# Patient Record
Sex: Male | Born: 1980 | Race: White | Hispanic: No | Smoking: Former smoker
Health system: Southern US, Community
[De-identification: ages and names within clinical notes are randomized; demographics above are authoritative.]

## PROBLEM LIST (undated history)

## (undated) DIAGNOSIS — R3915 Urgency of urination: Secondary | ICD-10-CM

## (undated) DIAGNOSIS — R519 Headache, unspecified: Secondary | ICD-10-CM

## (undated) DIAGNOSIS — N200 Calculus of kidney: Secondary | ICD-10-CM

## (undated) DIAGNOSIS — E236 Other disorders of pituitary gland: Secondary | ICD-10-CM

## (undated) DIAGNOSIS — Z87442 Personal history of urinary calculi: Secondary | ICD-10-CM

## (undated) DIAGNOSIS — R51 Headache: Secondary | ICD-10-CM

## (undated) DIAGNOSIS — H5319 Other subjective visual disturbances: Secondary | ICD-10-CM

## (undated) DIAGNOSIS — E790 Hyperuricemia without signs of inflammatory arthritis and tophaceous disease: Secondary | ICD-10-CM

## (undated) DIAGNOSIS — K219 Gastro-esophageal reflux disease without esophagitis: Secondary | ICD-10-CM

## (undated) DIAGNOSIS — Z8709 Personal history of other diseases of the respiratory system: Secondary | ICD-10-CM

## (undated) DIAGNOSIS — E291 Testicular hypofunction: Secondary | ICD-10-CM

## (undated) HISTORY — DX: Other disorders of pituitary gland: E23.6

## (undated) HISTORY — PX: EXTRACORPOREAL SHOCK WAVE LITHOTRIPSY: SHX1557

## (undated) HISTORY — PX: HYPOSPADIAS CORRECTION: SHX483

---

## 2003-03-04 ENCOUNTER — Encounter: Payer: Self-pay | Admitting: Emergency Medicine

## 2003-03-04 ENCOUNTER — Emergency Department (HOSPITAL_COMMUNITY): Admission: EM | Admit: 2003-03-04 | Discharge: 2003-03-04 | Payer: Self-pay | Admitting: Emergency Medicine

## 2008-05-12 HISTORY — PX: REFRACTIVE SURGERY: SHX103

## 2008-08-17 ENCOUNTER — Encounter: Admission: RE | Admit: 2008-08-17 | Discharge: 2008-08-17 | Payer: Self-pay | Admitting: Family Medicine

## 2008-08-22 ENCOUNTER — Encounter: Payer: Self-pay | Admitting: Endocrinology

## 2008-08-30 ENCOUNTER — Encounter: Admission: RE | Admit: 2008-08-30 | Discharge: 2008-08-30 | Payer: Self-pay | Admitting: Family Medicine

## 2008-08-31 ENCOUNTER — Encounter: Payer: Self-pay | Admitting: Endocrinology

## 2008-09-08 ENCOUNTER — Encounter: Admission: RE | Admit: 2008-09-08 | Discharge: 2008-09-08 | Payer: Self-pay | Admitting: Neurology

## 2008-09-11 ENCOUNTER — Encounter: Payer: Self-pay | Admitting: Endocrinology

## 2008-09-18 ENCOUNTER — Encounter: Payer: Self-pay | Admitting: Endocrinology

## 2008-09-29 ENCOUNTER — Ambulatory Visit: Payer: Self-pay | Admitting: Endocrinology

## 2008-09-29 DIAGNOSIS — E236 Other disorders of pituitary gland: Secondary | ICD-10-CM | POA: Insufficient documentation

## 2008-09-29 DIAGNOSIS — J309 Allergic rhinitis, unspecified: Secondary | ICD-10-CM | POA: Insufficient documentation

## 2008-10-04 ENCOUNTER — Ambulatory Visit: Payer: Self-pay | Admitting: Endocrinology

## 2008-10-19 LAB — CONVERTED CEMR LAB
Calcium: 9.3 mg/dL (ref 8.4–10.5)
Creatinine, Ser: 0.9 mg/dL (ref 0.4–1.5)
Sodium: 142 meq/L (ref 135–145)
Testosterone: 212.19 ng/dL — ABNORMAL LOW (ref 350.00–890.00)

## 2009-07-04 ENCOUNTER — Ambulatory Visit: Payer: Self-pay | Admitting: Endocrinology

## 2009-07-04 DIAGNOSIS — E291 Testicular hypofunction: Secondary | ICD-10-CM | POA: Insufficient documentation

## 2009-07-04 DIAGNOSIS — N209 Urinary calculus, unspecified: Secondary | ICD-10-CM | POA: Insufficient documentation

## 2009-08-03 ENCOUNTER — Ambulatory Visit: Payer: Self-pay | Admitting: Endocrinology

## 2009-08-05 LAB — CONVERTED CEMR LAB: Cortisol, Plasma: 0.6 ug/dL

## 2009-08-10 ENCOUNTER — Telehealth: Payer: Self-pay | Admitting: Endocrinology

## 2009-12-11 ENCOUNTER — Encounter: Payer: Self-pay | Admitting: Endocrinology

## 2010-03-19 ENCOUNTER — Encounter: Admission: RE | Admit: 2010-03-19 | Discharge: 2010-03-19 | Payer: Self-pay | Admitting: Neurology

## 2010-06-02 ENCOUNTER — Encounter: Payer: Self-pay | Admitting: Neurology

## 2010-06-11 NOTE — Progress Notes (Signed)
Summary: RX  Phone Note Call from Patient Call back at Home Phone [phone]   Caller: Patient [phone] Summary of Call: pt called to inform MD that he would like to start Clomid Rx Initial call taken by: Margaret Pyle, CMA,  August 10, 2009 1:27 PM  Follow-up for Phone Call        i sent rx ret 4-6 weeks Follow-up by: Minus Breeding MD,  August 10, 2009 1:30 PM  Additional Follow-up for Phone Call Additional follow up Details #1::        pt informed and will call back to sch Additional Follow-up by: Margaret Pyle, CMA,  August 10, 2009 1:41 PM    New/Updated Medications: CLOMIPHENE CITRATE 50 MG TABS (CLOMIPHENE CITRATE) 1/4 tab once daily Prescriptions: CLOMIPHENE CITRATE 50 MG TABS (CLOMIPHENE CITRATE) 1/4 tab once daily  #15 x 1   Entered and Authorized by:   Minus Breeding MD   Signed by:   Minus Breeding MD on 08/10/2009   Method used:   Electronically to        [id number] (retail)       [address]       [place], [place]  [address]       Ph: [phone] or [phone]       Fax: [phone]   RxID:   [id number]

## 2010-06-11 NOTE — Letter (Signed)
Summary: [facility]  [facility]   Imported By: Sherian Rein 12/17/2009 12:24:01  _____________________________________________________________________  External Attachment:    Type:   Image     Comment:   External Document

## 2010-06-11 NOTE — Assessment & Plan Note (Signed)
Summary: PT WANTS TO F/U WITH SAE/#/CD   Vital Signs:  Patient profile:   30 year old male Height:      69 inches (175.26 cm) Weight:      229 pounds (104.09 kg) BMI:     33.94 O2 Sat:      98 % on Room air Temp:     97.6 degrees F (36.44 degrees C) oral Pulse rate:   73 / minute BP sitting:   120 / 70  (left arm) Cuff size:   large  Vitals Entered By: Josph Macho RMA (July 04, 2009 11:09 AM)  O2 Flow:  Room air CC: Follow-up visit/ pt states he is no longer using Loratadine, Zaditor, Veramyst, or Bepreve/ CF Is Patient Diabetic? No   Referring Provider:  Dr Amelia Jo Primary Provider:  Dr Juluis Rainier  CC:  Follow-up visit/ pt states he is no longer using Loratadine, Zaditor, Veramyst, and or Bepreve/ CF.  History of Present Illness: pt states he feels well in general.  he has h/o small pituitary adenoma. he has no change in chronic visual disturbances.   he states slightly decreased libido.  he has never taken any androgen products.  Current Medications (verified): 1)  Ketoprofen 75 Mg Caps (Ketoprofen) .Marland Kitchen.. 1 Tab As Needed 2)  Divalproex Sodium 250 Mg Tbec (Divalproex Sodium) .Marland Kitchen.. 1 Tab Daily 3)  Isometheptene-Apap-Dichloral 65-325-100 Mg Caps (Apap-Isometheptene-Dichloral) .... As Needed 4)  Prilosec Otc 20 Mg Tbec (Omeprazole Magnesium) .Marland Kitchen.. 1 Tab Daily 5)  Gnp Loratadine 10 Mg Tabs (Loratadine) .Marland Kitchen.. 1 Tab Daily 6)  Zaditor 0.025 % Soln (Ketotifen Fumarate) .... 2 Drops Each Eye Daily 7)  Veramyst 27.5 Mcg/spray Susp (Fluticasone Furoate) .... Prn 8)  Bepreve 1.5 % Soln (Bepotastine Besilate) .... Prn 9)  Dexamethasone 1 Mg Tabs (Dexamethasone) .Marland Kitchen.. 1 Tab At 10 Pm The Night Before Blood Test  Allergies (verified): No Known Drug Allergies  Family History: no pituitary dz.  Social History: Reviewed history from 09/29/2008 and no changes required. single.  has domestic partner. works Education officer, environmental.  Review of Systems       denies erectile  dysfunction.  he denies headache  Physical Exam  General:  normal appearance.   Genitalia:  Normal external male genitalia with no urethral discharge.    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 Take at 10 pm, the night before blood test  Other Orders: Est. Patient Level IV (295[address])  Patient Instructions: 1)  tests are being ordered for you today.  a few days after the test(s), please call [phone] to hear your test results. 2)  i'll hold-off on the mri of the pituitary, because dr lewitt is going to do the whole-brain mri anyway.  for your needs, the whole brain mri would be sufficient.should do a "dexamethasone suppression test."  for this, you would take dexamethasone 1 mg (i have sent prescription to your pharmacy) at 10 pm, then come in for a "cortisol" blood test (253.8)  the next morning before 9 am.  you do not need to be fasting for this test. 3)  we'll also do the other blood tests then: 4)  prolactin, testosterone, and LH (all 257.2). 5)  cc dr lewitt 6)  we discussed the  risks of normalization of testosterone, including increased fertility, hair loss, prostate cancer, benign prostate enlargement, lower hdl, sleep apnea, and behavior changes 7)  we discussed clomiphine if testosterone and LH are low Prescriptions: DEXAMETHASONE 1 MG TABS (DEXAMETHASONE) take at 10 pm, the night before blood test  #1 tab x 0   Entered and Authorized by:   Minus Breeding MD   Signed by:   Minus Breeding MD on 07/04/2009   Method used:   Electronically to        [id number] (retail)       [address]       [place], [place]  [address]       Ph: [phone] or [phone]       Fax: [phone]   RxID:    [id number]

## 2010-06-19 ENCOUNTER — Ambulatory Visit (HOSPITAL_COMMUNITY): Payer: Self-pay

## 2010-06-27 ENCOUNTER — Ambulatory Visit (HOSPITAL_COMMUNITY): Payer: Self-pay

## 2010-07-31 ENCOUNTER — Ambulatory Visit (INDEPENDENT_AMBULATORY_CARE_PROVIDER_SITE_OTHER): Payer: Managed Care, Other (non HMO) | Admitting: Endocrinology

## 2010-07-31 ENCOUNTER — Other Ambulatory Visit (INDEPENDENT_AMBULATORY_CARE_PROVIDER_SITE_OTHER): Payer: Managed Care, Other (non HMO)

## 2010-07-31 ENCOUNTER — Encounter: Payer: Self-pay | Admitting: Endocrinology

## 2010-07-31 VITALS — BP 114/66 | HR 75 | Temp 98.3°F | Ht 69.0 in | Wt 214.8 lb

## 2010-07-31 DIAGNOSIS — R519 Headache, unspecified: Secondary | ICD-10-CM | POA: Insufficient documentation

## 2010-07-31 DIAGNOSIS — E291 Testicular hypofunction: Secondary | ICD-10-CM

## 2010-07-31 DIAGNOSIS — R51 Headache: Secondary | ICD-10-CM

## 2010-07-31 MED ORDER — CLOMIPHENE CITRATE 50 MG PO TABS: ORAL_TABLET | ORAL | Status: DC

## 2010-07-31 NOTE — Progress Notes (Signed)
  Subjective:    Patient ID: Raymond Chandler, male    DOB: Oct 13, 1980, 30 y.o.   MRN: [id number]  HPI Pt is here to f/u hypogonadism, and pituitary adenoma.  He had a f/u mri 4 mos ago, which was unchanged.  pt states he feels well in general.  He only took clomid x 1 month "because i was going to so many doctors."  However, he says it helped his general sxs.  He has decreased libido, but no erectile dysfunction.  He has some anxiety, and what he describes as a short temper.   Past Medical History  Diagnosis Date  . OTH PITUITARY DISORDERS & SYNDROMES 09/29/2008  . HYPOGONADISM 07/04/2009  . URINARY CALCULUS 07/04/2009   History reviewed. No pertinent past surgical history.  reports that he has been smoking Cigars.  He does not have any smokeless tobacco history on file. He reports that he drinks alcohol. He reports that he does not use illicit drugs. family history is not on file. No Known Allergies  Review of Systems He also has some dysuria, which he attributes to urolithiasis, for which he sees urol.      Objective:   Physical Exam Gen: no distress Exernal genitalia: normal. Ext: no edema Psych:  Does not appear anxious nor depressed.        Assessment & Plan:  Secondary hypogonadism.  He needs to resume clomid.

## 2010-07-31 NOTE — Patient Instructions (Addendum)
blood tests are being ordered for you today.  please call [phone] to hear your test results. normalization of testosterone is not known to harm you.  however, there are "theoretical" risks, including increased fertility, hair loss, prostate cancer, benign prostate enlargement, lower hdl ("good cholesterol"), sleep apnea, and behavior changes Return here in 6 months. (update: i left message on phone-tree:  i refilled clomid).

## 2011-09-10 ENCOUNTER — Ambulatory Visit (INDEPENDENT_AMBULATORY_CARE_PROVIDER_SITE_OTHER): Payer: Managed Care, Other (non HMO) | Admitting: Endocrinology

## 2011-09-10 ENCOUNTER — Encounter: Payer: Self-pay | Admitting: Endocrinology

## 2011-09-10 VITALS — BP 102/72 | HR 96 | Temp 97.2°F | Ht 69.0 in | Wt 209.0 lb

## 2011-09-10 DIAGNOSIS — E236 Other disorders of pituitary gland: Secondary | ICD-10-CM

## 2011-09-10 DIAGNOSIS — E291 Testicular hypofunction: Secondary | ICD-10-CM

## 2011-09-10 MED ORDER — CLOMIPHENE CITRATE 50 MG PO TABS: ORAL_TABLET | ORAL | Status: DC

## 2011-09-10 NOTE — Patient Instructions (Addendum)
Resume the clomiphine.  i have sent a prescription to your pharmacy blood tests are being requested for you.   Please have drawn in 1 month.  You will receive a letter with results. Please return in 1 year. normalization of testosterone is not known to harm you.  however, there are "theoretical" risks, including increased fertility, hair loss, prostate cancer, benign prostate enlargement, blood clots, liver problems, lower hdl ("good cholesterol"), sleep apnea, and behavior changes.

## 2011-09-10 NOTE — Progress Notes (Signed)
  Subjective:    Patient ID: Raymond Chandler, male    DOB: [birth year], 31 y.o.   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  He ran out of clomid 1 month ago.  On the clomid, he still had slight ED sxs.  He shaves every day he works.   Past Medical History  Diagnosis Date  . OTH PITUITARY DISORDERS & SYNDROMES 09/29/2008  . HYPOGONADISM 07/04/2009  . URINARY CALCULUS 07/04/2009    No past surgical history on file.  History   Social History  . Marital Status: Single    Spouse Name: N/A    Number of Children: N/A  . Years of Education: N/A   Occupational History  .      Works Education officer, environmental   Social History Main Topics  . Smoking status: Former Smoker    Types: Cigars  . Smokeless tobacco: Not on file   Comment: smokes cigars maybe twice a year  . Alcohol Use: Yes     rarely (4 alcoholic drinks per month-wine and beer)  . Drug Use: No  . Sexually Active: Not on file   Other Topics Concern  . Not on file   Social History Narrative   Has domestic partner    Current Outpatient Prescriptions on File Prior to Visit  Medication Sig Dispense Refill  . allopurinol (ZYLOPRIM) 100 MG tablet Take 100 mg by mouth daily.        Marland Kitchen aspirin 81 MG tablet Take 81 mg by mouth daily.        . hydrochlorothiazide 25 MG tablet Take 25 mg by mouth daily.        Marland Kitchen imipramine (TOFRANIL) 25 MG tablet       . omeprazole (PRILOSEC OTC) 20 MG tablet Take 20 mg by mouth daily.         No Known Allergies  No family history on file.  BP 102/72  Pulse 96  Temp(Src) 97.2 F (36.2 C) (Oral)  Ht 5\' 9"  (1.753 m)  Wt 209 lb (94.802 kg)  BMI 30.86 kg/m2  SpO2 99%  Review of Systems He has lost weight, due to his efforts.      Objective:   Physical Exam VITAL SIGNS:  See vs page GENERAL: no distress GENITALIA: Normal male testicles, scrotum, and penis Skin: normal hair distribution     Assessment & Plan:  Hypogonadism.  He should resume  clomid.

## 2011-09-24 ENCOUNTER — Other Ambulatory Visit: Payer: Self-pay | Admitting: Endocrinology

## 2011-09-24 DIAGNOSIS — E236 Other disorders of pituitary gland: Secondary | ICD-10-CM

## 2011-10-10 ENCOUNTER — Other Ambulatory Visit (INDEPENDENT_AMBULATORY_CARE_PROVIDER_SITE_OTHER): Payer: Managed Care, Other (non HMO)

## 2011-10-10 DIAGNOSIS — E236 Other disorders of pituitary gland: Secondary | ICD-10-CM

## 2011-10-10 LAB — FOLLICLE STIMULATING HORMONE: FSH: 4.6 m[IU]/mL (ref 1.4–18.1)

## 2011-10-10 LAB — LUTEINIZING HORMONE: LH: 6.26 m[IU]/mL (ref 1.50–9.30)

## 2011-10-11 LAB — PROLACTIN: Prolactin: 5.5 ng/mL (ref 2.1–17.1)

## 2011-10-13 ENCOUNTER — Encounter: Payer: Self-pay | Admitting: Endocrinology

## 2011-10-13 LAB — ACTH: C206 ACTH: 18 pg/mL (ref 10–46)

## 2011-10-14 ENCOUNTER — Telehealth: Payer: Self-pay | Admitting: *Deleted

## 2011-10-14 NOTE — Telephone Encounter (Signed)
Called pt to inform of lab results, pt informed (letter also mailed to pt). Labs also faxed to Dr. Glade Stanford office per pt's request and Dr. Glade Stanford request.

## 2011-10-15 LAB — GROWTH HORMONE: Growth Hormone: <0.1 ng/mL (ref 0.00–3.00)

## 2011-11-05 ENCOUNTER — Other Ambulatory Visit: Payer: Self-pay | Admitting: Endocrinology

## 2012-01-13 ENCOUNTER — Other Ambulatory Visit: Payer: Self-pay | Admitting: Neurology

## 2012-01-13 DIAGNOSIS — E236 Other disorders of pituitary gland: Secondary | ICD-10-CM

## 2012-01-15 ENCOUNTER — Inpatient Hospital Stay
Admission: RE | Admit: 2012-01-15 | Discharge: 2012-01-15 | Payer: Managed Care, Other (non HMO) | Source: Ambulatory Visit | Attending: Neurology | Admitting: Neurology

## 2012-12-08 ENCOUNTER — Other Ambulatory Visit: Payer: Self-pay | Admitting: Neurology

## 2012-12-08 DIAGNOSIS — E237 Disorder of pituitary gland, unspecified: Secondary | ICD-10-CM

## 2012-12-18 ENCOUNTER — Ambulatory Visit
Admission: RE | Admit: 2012-12-18 | Discharge: 2012-12-18 | Disposition: A | Discharge status: Home / Self Care | Payer: Managed Care, Other (non HMO) | Source: Ambulatory Visit | Attending: Neurology | Admitting: Neurology

## 2012-12-18 DIAGNOSIS — E237 Disorder of pituitary gland, unspecified: Secondary | ICD-10-CM

## 2012-12-18 MED ORDER — GADOBENATE DIMEGLUMINE 529 MG/ML IV SOLN
15.0000 mL | Freq: Once | INTRAVENOUS | Status: AC | PRN
  Administered 2012-12-18: 15 mL via INTRAVENOUS

## 2013-04-20 ENCOUNTER — Other Ambulatory Visit: Payer: Self-pay | Admitting: Urology

## 2013-04-26 ENCOUNTER — Other Ambulatory Visit: Payer: Self-pay | Admitting: Urology

## 2013-04-27 ENCOUNTER — Encounter (HOSPITAL_BASED_OUTPATIENT_CLINIC_OR_DEPARTMENT_OTHER): Payer: Self-pay | Admitting: *Deleted

## 2013-04-27 ENCOUNTER — Other Ambulatory Visit: Payer: Self-pay | Admitting: Urology

## 2013-04-27 NOTE — Progress Notes (Signed)
NPO AFTER MN. ARRIVE AT 0900. NEEDS HG. WILL TAKE ATENOLOL AND NEXIUM  AM DOS W/ SIPS OF WATER AND IF NEEDED TAKE HYDROCODONE. PLEASE GET NAME OF MIGRAINE MED. HE TAKES AT NIGHT.

## 2013-04-29 ENCOUNTER — Ambulatory Visit (HOSPITAL_BASED_OUTPATIENT_CLINIC_OR_DEPARTMENT_OTHER)
Admission: RE | Admit: 2013-04-29 | Discharge: 2013-04-29 | Disposition: A | Discharge status: Home / Self Care | Payer: BC Managed Care – PPO | Source: Ambulatory Visit | Attending: Urology | Admitting: Urology

## 2013-04-29 ENCOUNTER — Encounter (HOSPITAL_BASED_OUTPATIENT_CLINIC_OR_DEPARTMENT_OTHER): Payer: BC Managed Care – PPO | Admitting: Anesthesiology

## 2013-04-29 ENCOUNTER — Encounter (HOSPITAL_BASED_OUTPATIENT_CLINIC_OR_DEPARTMENT_OTHER): Payer: Self-pay | Admitting: Anesthesiology

## 2013-04-29 ENCOUNTER — Encounter (HOSPITAL_BASED_OUTPATIENT_CLINIC_OR_DEPARTMENT_OTHER)
Admission: RE | Disposition: A | Discharge status: Home / Self Care | Payer: Self-pay | Source: Ambulatory Visit | Attending: Urology

## 2013-04-29 ENCOUNTER — Ambulatory Visit (HOSPITAL_BASED_OUTPATIENT_CLINIC_OR_DEPARTMENT_OTHER): Payer: BC Managed Care – PPO | Admitting: Anesthesiology

## 2013-04-29 DIAGNOSIS — G43909 Migraine, unspecified, not intractable, without status migrainosus: Secondary | ICD-10-CM | POA: Insufficient documentation

## 2013-04-29 DIAGNOSIS — K219 Gastro-esophageal reflux disease without esophagitis: Secondary | ICD-10-CM | POA: Insufficient documentation

## 2013-04-29 DIAGNOSIS — N201 Calculus of ureter: Secondary | ICD-10-CM | POA: Insufficient documentation

## 2013-04-29 DIAGNOSIS — N200 Calculus of kidney: Secondary | ICD-10-CM | POA: Insufficient documentation

## 2013-04-29 DIAGNOSIS — N1330 Unspecified hydronephrosis: Secondary | ICD-10-CM | POA: Insufficient documentation

## 2013-04-29 DIAGNOSIS — N209 Urinary calculus, unspecified: Secondary | ICD-10-CM

## 2013-04-29 HISTORY — PX: CYSTOSCOPY/RETROGRADE/URETEROSCOPY/STONE EXTRACTION WITH BASKET: SHX5317

## 2013-04-29 HISTORY — DX: Personal history of urinary calculi: Z87.442

## 2013-04-29 HISTORY — DX: Testicular hypofunction: E29.1

## 2013-04-29 HISTORY — DX: Gastro-esophageal reflux disease without esophagitis: K21.9

## 2013-04-29 LAB — POCT HEMOGLOBIN-HEMACUE: Hemoglobin: 15.5 g/dL (ref 13.0–17.0)

## 2013-04-29 SURGERY — CYSTOSCOPY, WITH CALCULUS REMOVAL USING BASKET
Anesthesia: General | Site: Ureter | Laterality: Left

## 2013-04-29 MED ORDER — FENTANYL CITRATE 0.05 MG/ML IJ SOLN
INTRAMUSCULAR | Status: DC | PRN
  Administered 2013-04-29: 50 ug via INTRAVENOUS
  Administered 2013-04-29: 25 ug via INTRAVENOUS
  Administered 2013-04-29: 50 ug via INTRAVENOUS
  Administered 2013-04-29 (×3): 25 ug via INTRAVENOUS

## 2013-04-29 MED ORDER — ONDANSETRON HCL 8 MG PO TABS: 8.0000 mg | ORAL_TABLET | Freq: Three times a day (TID) | ORAL | Status: DC | PRN

## 2013-04-29 MED ORDER — GLYCOPYRROLATE 0.2 MG/ML IJ SOLN
INTRAMUSCULAR | Status: DC | PRN
  Administered 2013-04-29: 0.2 mg via INTRAVENOUS

## 2013-04-29 MED ORDER — BELLADONNA ALKALOIDS-OPIUM 16.2-60 MG RE SUPP
RECTAL | Status: DC | PRN
  Administered 2013-04-29: 1 via RECTAL

## 2013-04-29 MED ORDER — ACETAMINOPHEN 10 MG/ML IV SOLN
INTRAVENOUS | Status: DC | PRN
  Administered 2013-04-29: 1000 mg via INTRAVENOUS

## 2013-04-29 MED ORDER — FENTANYL CITRATE 0.05 MG/ML IJ SOLN
INTRAMUSCULAR | Status: AC
  Filled 2013-04-29: qty 4

## 2013-04-29 MED ORDER — CIPROFLOXACIN HCL 250 MG PO TABS
ORAL_TABLET | ORAL | Status: AC
  Filled 2013-04-29: qty 1

## 2013-04-29 MED ORDER — OXYCODONE-ACETAMINOPHEN 5-325 MG PO TABS: 1.0000 | ORAL_TABLET | ORAL | Status: DC | PRN

## 2013-04-29 MED ORDER — OXYBUTYNIN CHLORIDE 5 MG PO TABS: ORAL_TABLET | ORAL | Status: DC

## 2013-04-29 MED ORDER — KETOROLAC TROMETHAMINE 30 MG/ML IJ SOLN
INTRAMUSCULAR | Status: DC | PRN
  Administered 2013-04-29: 30 mg via INTRAVENOUS

## 2013-04-29 MED ORDER — PHENAZOPYRIDINE HCL 200 MG PO TABS: 200.0000 mg | ORAL_TABLET | Freq: Three times a day (TID) | ORAL | Status: DC | PRN

## 2013-04-29 MED ORDER — CEFAZOLIN SODIUM-DEXTROSE 2-3 GM-% IV SOLR
2.0000 g | INTRAVENOUS | Status: AC
  Administered 2013-04-29: 2 g via INTRAVENOUS
  Filled 2013-04-29: qty 50

## 2013-04-29 MED ORDER — MIDAZOLAM HCL 2 MG/2ML IJ SOLN
INTRAMUSCULAR | Status: AC
  Filled 2013-04-29: qty 2

## 2013-04-29 MED ORDER — CIPROFLOXACIN IN D5W 400 MG/200ML IV SOLN
INTRAVENOUS | Status: AC
  Filled 2013-04-29: qty 200

## 2013-04-29 MED ORDER — CEFAZOLIN SODIUM 1-5 GM-% IV SOLN
1.0000 g | INTRAVENOUS | Status: DC
  Filled 2013-04-29: qty 50

## 2013-04-29 MED ORDER — PROPOFOL 10 MG/ML IV BOLUS
INTRAVENOUS | Status: DC | PRN
  Administered 2013-04-29: 200 mg via INTRAVENOUS

## 2013-04-29 MED ORDER — LACTATED RINGERS IV SOLN
INTRAVENOUS | Status: DC
  Administered 2013-04-29 (×2): via INTRAVENOUS
  Filled 2013-04-29: qty 1000

## 2013-04-29 MED ORDER — PROMETHAZINE HCL 25 MG/ML IJ SOLN
6.2500 mg | INTRAMUSCULAR | Status: DC | PRN
  Filled 2013-04-29: qty 1

## 2013-04-29 MED ORDER — SODIUM CHLORIDE 0.9 % IR SOLN
Status: DC | PRN
  Administered 2013-04-29: 2000 mL

## 2013-04-29 MED ORDER — PHENAZOPYRIDINE HCL 100 MG PO TABS
ORAL_TABLET | ORAL | Status: AC
  Filled 2013-04-29: qty 2

## 2013-04-29 MED ORDER — TAMSULOSIN HCL 0.4 MG PO CAPS
0.4000 mg | ORAL_CAPSULE | Freq: Every day | ORAL | Status: DC
  Administered 2013-04-29: 0.4 mg via ORAL
  Filled 2013-04-29: qty 1

## 2013-04-29 MED ORDER — LIDOCAINE HCL (CARDIAC) 20 MG/ML IV SOLN
INTRAVENOUS | Status: DC | PRN
  Administered 2013-04-29: 100 mg via INTRAVENOUS

## 2013-04-29 MED ORDER — DEXAMETHASONE SODIUM PHOSPHATE 4 MG/ML IJ SOLN
INTRAMUSCULAR | Status: DC | PRN
  Administered 2013-04-29: 10 mg via INTRAVENOUS

## 2013-04-29 MED ORDER — OXYCODONE-ACETAMINOPHEN 5-325 MG PO TABS
1.0000 | ORAL_TABLET | ORAL | Status: DC | PRN
  Administered 2013-04-29 (×2): 1 via ORAL
  Filled 2013-04-29: qty 1

## 2013-04-29 MED ORDER — IOHEXOL 350 MG/ML SOLN
INTRAVENOUS | Status: DC | PRN
  Administered 2013-04-29: 50 mL

## 2013-04-29 MED ORDER — BELLADONNA ALKALOIDS-OPIUM 16.2-60 MG RE SUPP
RECTAL | Status: AC
  Filled 2013-04-29: qty 1

## 2013-04-29 MED ORDER — TAMSULOSIN HCL 0.4 MG PO CAPS
ORAL_CAPSULE | ORAL | Status: AC
  Filled 2013-04-29: qty 1

## 2013-04-29 MED ORDER — OXYCODONE-ACETAMINOPHEN 5-325 MG PO TABS
ORAL_TABLET | ORAL | Status: AC
  Filled 2013-04-29: qty 1

## 2013-04-29 MED ORDER — OXYBUTYNIN CHLORIDE 5 MG PO TABS
5.0000 mg | ORAL_TABLET | Freq: Three times a day (TID) | ORAL | Status: DC | PRN
  Administered 2013-04-29: 5 mg via ORAL
  Filled 2013-04-29: qty 1

## 2013-04-29 MED ORDER — STERILE WATER FOR IRRIGATION IR SOLN
Status: DC | PRN
  Administered 2013-04-29: 500 mL

## 2013-04-29 MED ORDER — TRIMETHOPRIM 100 MG PO TABS: 100.0000 mg | ORAL_TABLET | ORAL | Status: DC

## 2013-04-29 MED ORDER — FENTANYL CITRATE 0.05 MG/ML IJ SOLN
25.0000 ug | INTRAMUSCULAR | Status: DC | PRN
  Filled 2013-04-29: qty 1

## 2013-04-29 MED ORDER — OXYBUTYNIN CHLORIDE 5 MG PO TABS
ORAL_TABLET | ORAL | Status: AC
  Filled 2013-04-29: qty 1

## 2013-04-29 MED ORDER — PHENAZOPYRIDINE HCL 200 MG PO TABS
200.0000 mg | ORAL_TABLET | Freq: Three times a day (TID) | ORAL | Status: DC
  Administered 2013-04-29: 200 mg via ORAL
  Filled 2013-04-29: qty 1

## 2013-04-29 MED ORDER — TAMSULOSIN HCL 0.4 MG PO CAPS: 0.4000 mg | ORAL_CAPSULE | Freq: Every day | ORAL | Status: DC

## 2013-04-29 MED ORDER — ONDANSETRON HCL 4 MG/2ML IJ SOLN
INTRAMUSCULAR | Status: DC | PRN
  Administered 2013-04-29: 4 mg via INTRAVENOUS

## 2013-04-29 MED ORDER — MIDAZOLAM HCL 5 MG/5ML IJ SOLN
INTRAMUSCULAR | Status: DC | PRN
  Administered 2013-04-29: 2 mg via INTRAVENOUS

## 2013-04-29 MED ORDER — MEPERIDINE HCL 25 MG/ML IJ SOLN
6.2500 mg | INTRAMUSCULAR | Status: DC | PRN
  Filled 2013-04-29: qty 1

## 2013-04-29 MED ORDER — LACTATED RINGERS IV SOLN
INTRAVENOUS | Status: DC
  Filled 2013-04-29: qty 1000

## 2013-04-29 SURGICAL SUPPLY — 36 items
ADAPTER CATH URET PLST 4-6FR (CATHETERS) ×3 IMPLANT
BAG DRAIN URO-CYSTO SKYTR STRL (DRAIN) ×3 IMPLANT
BASKET LASER NITINOL 1.9FR (BASKET) IMPLANT
BASKET STNLS GEMINI 4WIRE 3FR (BASKET) IMPLANT
BASKET ZERO TIP NITINOL 2.4FR (BASKET) ×3 IMPLANT
BOOTIES KNEE HIGH SLOAN (MISCELLANEOUS) ×3 IMPLANT
BRUSH URET BIOPSY 3F (UROLOGICAL SUPPLIES) IMPLANT
CANISTER SUCT LVC 12 LTR MEDI- (MISCELLANEOUS) ×3 IMPLANT
CATH CLEAR GEL 3F BACKSTOP (CATHETERS) ×3 IMPLANT
CATH INTERMIT  6FR 70CM (CATHETERS) ×3 IMPLANT
CATH URET 5FR 28IN CONE TIP (BALLOONS)
CATH URET 5FR 28IN OPEN ENDED (CATHETERS) IMPLANT
CATH URET 5FR 70CM CONE TIP (BALLOONS) IMPLANT
CATH URET DUAL LUMEN 6-10FR 50 (CATHETERS) IMPLANT
CLOTH BEACON ORANGE TIMEOUT ST (SAFETY) ×3 IMPLANT
DRAPE CAMERA CLOSED 9X96 (DRAPES) ×3 IMPLANT
ELECT REM PT RETURN 9FT ADLT (ELECTROSURGICAL)
ELECTRODE REM PT RTRN 9FT ADLT (ELECTROSURGICAL) IMPLANT
FIBER LASER FLEXIVA 365 (UROLOGICAL SUPPLIES) ×3 IMPLANT
GLOVE BIO SURGEON STRL SZ7 (GLOVE) ×3 IMPLANT
GOWN PREVENTION PLUS LG XLONG (DISPOSABLE) ×3 IMPLANT
GOWN STRL REIN XL XLG (GOWN DISPOSABLE) ×3 IMPLANT
GUIDEWIRE 0.038 PTFE COATED (WIRE) IMPLANT
GUIDEWIRE ANG ZIPWIRE 038X150 (WIRE) ×3 IMPLANT
GUIDEWIRE STR DUAL SENSOR (WIRE) ×6 IMPLANT
IV NS 1000ML (IV SOLUTION) ×6
IV NS 1000ML BAXH (IV SOLUTION) ×12 IMPLANT
IV NS IRRIG 3000ML ARTHROMATIC (IV SOLUTION) IMPLANT
KIT BALLIN UROMAX 15FX10 (LABEL) IMPLANT
KIT BALLN UROMAX 15FX4 (MISCELLANEOUS) IMPLANT
KIT BALLN UROMAX 26 75X4 (MISCELLANEOUS)
SET HIGH PRES BAL DIL (LABEL)
SHEATH ACCESS URETERAL 24CM (SHEATH) ×3 IMPLANT
SHEATH ACCESS URETERAL 38CM (SHEATH) IMPLANT
SHEATH ACCESS URETERAL 54CM (SHEATH) IMPLANT
STENT URET 6FRX24 CONTOUR (STENTS) ×6 IMPLANT

## 2013-04-29 NOTE — Anesthesia Procedure Notes (Signed)
Procedure Name: LMA Insertion Date/Time: 04/29/2013 10:17 AM Performed by: Norva Pavlov Pre-anesthesia Checklist: Patient identified, Emergency Drugs available, Suction available and Patient being monitored Patient Re-evaluated:Patient Re-evaluated prior to inductionOxygen Delivery Method: Circle System Utilized Preoxygenation: Pre-oxygenation with 100% oxygen Intubation Type: IV induction Ventilation: Mask ventilation without difficulty LMA: LMA inserted LMA Size: 4.0 Number of attempts: 1 Airway Equipment and Method: bite block Placement Confirmation: positive ETCO2 Tube secured with: Tape Dental Injury: Teeth and Oropharynx as per pre-operative assessment

## 2013-04-29 NOTE — Op Note (Signed)
Pre-operative diagnosis : Left upper ureteral calculus in right upper ureteral calculus. Severe obstruction of left kidney, symptomatic right ureteral stone Postoperative diagnosis:  Same  Operation:  Cystourethroscopy, bilateral retrograde pyelogram with interpretation, left ureteroscopy, rigid, and flexible. Retrograde PolyGram interpretation, implantation of backstop, laser fragmentation of multifaceted impacted left upper ureteral calculus, basket extraction of stone fragments, left double-J stent; right retrograde PolyGram interpretation, and right double-J stent (both stents: 6 French by 24 cm)  Surgeon:  S. Patsi Sears, MD  First assistant:  None  Anesthesia:  General LMA  Preparation:  After appropriate preanesthesia, the patient is brought the operative room, placed in the upper table in dorsal supine position where general LMA anesthesia was introduced. The arm band was double checked. The history was double checked. It is noted that the patient is more sent back on the right side, but the left side as noted to have more obstruction. Pole patient that I would operate on his left ureteral stone first, the lead bilateral double-J stent. I would only go after the right ureteral stone at the left ureteral calculus was "easy" to retrieve.  Review history:  Raymond Chandler is a 32 yr old male patient of Dr. Imelda Pillow w/ GU hx of kidney stones. He was originally referred from Dr. Zachery Dauer for evaluation of nephrolithiasis. Has chronically passes numerous stones over the past several years ever since he was 32 yrs old. Kidney stone Pain is controlled with PRN Ibuprofen and Vicodin . ESWL 8+ years ago. + FH of nephrolithiasis in both parents. Admits to 2 caffeinated drinks/day, 1 ETOH/week, and will drinks large amounts of cranberry juice and beer to help pass stone.  01/18/10 labs: Uric acid - 6.6, Calcium - 9.9, and PTH - 41.0. He is no longer taking Allopurinol 100mg  QD & HCTZ 25mg  QD.  His PMH is  significant for migraines which require a regimen of desprimaine 3x QHS and atenolol 2x in am and naratriptan PRN  - May 2014 for bilateral flank pain and CT stone protocol on 09/23/12 showed bilateral renal stones w/ 6mm obstructing proximal left ureteral stone. He was started on MET, given Sprix and Vicodin for pain control but unable to take Sprix due to worsening of migraine. He was supposed to f/u in 3 weeks but never did because he was able to pass several stones during the interim. He had a UTI one week after he saw [place] in May and resolved w/ abx given by PCP.  - Mar 30, 2013 : c/o 3-wk hx of left flank pain 7/10 radiates to left testicle. KUB noted 6mm mid left ureteral stone w/ mild-moderate hydronephrosis on renal US. He was started on MET to strain for stone, renewal of Vicodin, and Meloxicam added for testicular pain while d/c ibuprofen.  - Apr 05, 2013 : c/o 3-day hx of right flank pain. CT indicates new proximal 5mm right ureteral stone and stable left 6mm mid ureteral stone. Flomax was changed to Rapaflo and pt to continue straining for stone.      Statement of  Likelihood of Success: Excellent. TIME-OUT observed.:  Procedure:  Cystourethroscopy was accomplished, and showed normal appearing urethra, except for hypospadiac distal urethra. Hypospadias was at the distal portion of the urethra. No stricture was noted.  The bladder base was normal. It was difficult to find the ureter ureteral orifice on both sides. However, the right ureteral orifice was identified, and retrograde pyelogram showed normal appearing ureter. Was very difficult to find stone, as there appeared to be very  little calcium in any stone present. Retrograde PolyGram did not reveal a stone, but did reveal obstructed ureter, in the upper portion of the ureter. Some contrast did go past, however, into the renal pelvis and calyces. An 038 guidewire was then passed around this obstruction into the renal pelvis and  coiled.  Attention was then directed to the left ureteral orifice, which was identified. Minimal urine output was noted from this orifice as well. Retrograde PolyGram was performed, but I could not see any contrast going up the ureter. Therefore, the ureteroscope was passed into the ureteral orifice, and the ureter was noted to be quite angulated. A guidewire was passed into the true orifice, and true ureter. The short rigid scope could not be passed around the angulation, which caused beginning of the short semirigid ureteroscope. Therefore, this was removed, and the sheath short ureteral access sheath was placed after 2 guidewires were placed through the ureteroscope. The flexible digital scope was then passed through the access sheath, into the upper ureter, and a multifaceted impacted stone was identified. Around this, backstop was injected, and following this, the 360  laser fiber was passed, and laser fragmentation of the stone was accomplished, using 0.5/5, 0.5/10, 1.0/10 settings. Following fragmentation of the stone, the 4 wire flat basket was used to extract all portions of the stone. The access sheath was removed, and one of the guidewires was removed. A 6 French by 24 cm double-J stent was then passed and coiled in the left renal pelvis, and the bladder.  Because of the degree of difficulty of the left upper ureteral stone, elected to pass a double-J stent on the right side. A lateral his left kidney to recover, prior to attempting right upper ureteral surgery. His right ureter will be protected with a double-J stent.  According, a 6 [place] by 24 cm right-sided double-J stent was passed into the right renal pelvis, and coiled in the bladder. KUB showed some uncoiling of the double-J stent in the renal pelvis, but it was well above the stone, and felt to be protective of the ureter. Therefore, the patient received IV Toradol, as well as IV Tylenol. Xylocaine jelly was placed the urethra, and the  patient was awakened and taken to recovery room in good condition.

## 2013-04-29 NOTE — Interval H&P Note (Signed)
History and Physical Interval Note:  04/29/2013 8:46 AM  Raymond Chandler  has presented today for surgery, with the diagnosis of Bilateral Ureteral Stones  The various methods of treatment have been discussed with the patient and family. After consideration of risks, benefits and other options for treatment, the patient has consented to  Procedure(s): CYSTOSCOPY/BILATERAL RETROGRADE LEFT URETEROSCOPY/STONE REMOVAL WITH HOLMIUM LASER AND DIGITAL URETEROSCOPE    (Left) HOLMIUM LASER AND DIGITAL URETEROSCOPE  (Left) as a surgical intervention .  The patient's history has been reviewed, patient examined, no change in status, stable for surgery.  I have reviewed the patient's chart and labs.  Questions were answered to the patient's satisfaction.     Jethro Bolus I

## 2013-04-29 NOTE — Anesthesia Preprocedure Evaluation (Signed)
Anesthesia Evaluation  Patient identified by MRN, date of birth, ID band Patient awake    Reviewed: Allergy & Precautions, H&P , NPO status , Patient's Chart, lab work & pertinent test results  Airway Mallampati: II TM Distance: >3 FB Neck ROM: Full    Dental no notable dental hx. (+) Teeth Intact   Pulmonary neg pulmonary ROS,  breath sounds clear to auscultation  Pulmonary exam normal       Cardiovascular negative cardio ROS  Rhythm:Regular Rate:Normal     Neuro/Psych negative neurological ROS  negative psych ROS   GI/Hepatic negative GI ROS, Neg liver ROS, GERD-  Medicated and Controlled,  Endo/Other  negative endocrine ROS  Renal/GU negative Renal ROS  negative genitourinary   Musculoskeletal negative musculoskeletal ROS (+)   Abdominal   Peds negative pediatric ROS (+)  Hematology negative hematology ROS (+)   Anesthesia Other Findings   Reproductive/Obstetrics negative OB ROS                           Anesthesia Physical Anesthesia Plan  ASA: I  Anesthesia Plan: General   Post-op Pain Management:    Induction: Intravenous  Airway Management Planned: LMA  Additional Equipment:   Intra-op Plan:   Post-operative Plan: Extubation in OR  Informed Consent: I have reviewed the patients History and Physical, chart, labs and discussed the procedure including the risks, benefits and alternatives for the proposed anesthesia with the patient or authorized representative who has indicated his/her understanding and acceptance.   Dental advisory given  Plan Discussed with: CRNA  Anesthesia Plan Comments:         Anesthesia Quick Evaluation

## 2013-04-29 NOTE — Transfer of Care (Signed)
Immediate Anesthesia Transfer of Care Note  Patient: Raymond Chandler  Procedure(s) Performed: Procedure(s) (LRB): CYSTOSCOPY/BILATERAL RETROGRADE LEFT URETEROSCOPY/STONE REMOVAL WITH HOLMIUM LASER AND DIGITAL URETEROSCOPE    (Left) HOLMIUM LASER AND DIGITAL URETEROSCOPE  (Left)  Patient Location: PACU  Anesthesia Type: General  Level of Consciousness: awake, alert  and oriented  Airway & Oxygen Therapy: Patient Spontanous Breathing and Patient connected to face mask oxygen  Post-op Assessment: Report given to PACU RN and Post -op Vital signs reviewed and stable  Post vital signs: Reviewed and stable  Complications: No apparent anesthesia complications

## 2013-04-29 NOTE — H&P (Signed)
eason For Visit 2 wk follow up on right ureteral stone   History of Present Illness   Raymond Chandler is a 32 yr old male patient of Dr. Imelda Pillow w/ GU hx of kidney stones. He was originally referred from Dr. Zachery Dauer for evaluation of nephrolithiasis. Has chronically passes numerous stones over the past several years ever since he was 32 yrs old. Kidney stone Pain is controlled with PRN Ibuprofen and Vicodin . ESWL 8+ years ago. + FH of nephrolithiasis in both parents. Admits to 2 caffeinated drinks/day, 1 ETOH/week, and will drinks large amounts of cranberry juice and beer to help pass stone.     01/18/10 labs: Uric acid - 6.6, Calcium - 9.9, and PTH - 41.0.  He is no longer taking Allopurinol 100mg  QD & HCTZ 25mg  QD.     His PMH is significant for migraines which require a regimen of desprimaine 3x QHS and atenolol 2x in am and naratriptan PRN    -  May 2014 for bilateral flank pain and CT stone protocol on 09/23/12 showed bilateral renal stones w/ 6mm obstructing proximal left ureteral stone. He was started on MET, given Sprix and Vicodin for pain control but unable to take Sprix due to worsening of migraine. He was supposed to f/u in 3 weeks but never did because he was able to pass several stones during the interim. He had a UTI one week after he saw [place] in May and resolved w/ abx given by PCP.  -  Mar 30, 2013 : c/o 3-wk hx of left flank pain 7/10 radiates to left testicle. KUB noted 6mm mid left ureteral stone w/ mild-moderate hydronephrosis on renal US. He was started on MET to strain for stone, renewal of Vicodin, and Meloxicam added for testicular pain while d/c ibuprofen.   - Apr 05, 2013 : c/o 3-day hx of right flank pain. CT indicates new proximal 5mm right ureteral stone and stable left 6mm mid ureteral stone. Flomax was changed to Rapaflo and pt to continue straining for stone.    Interval hx:  Raymond Chandler returns today for f/u KUB to monitor for the progression of the  bilateral stones in mid ureters after having been on MET and straining for stones for 3 weeks for the left ureteral stone and 2 weeks on the right ureteral stone. He has been taking Rapaflo for the past 2 weeks instead of Flomax just to see if this is more effective in helping pass the stone but only noted a small speck of stone passed and he is not pleased w/ more significant retrograde ejaculation.  He has no new complaints today. Pain is currently 2/10 in severity and not too bothersome to him but he does have intermittent flare up of pain 8/10 several times a day and heating pad and vicodin help to "take the edge off the pain". He has been reducing the Vicodin dose to 1/2 tablet daily and meloxicam. Last dose of vicodin was 12 hours ago. He has noticed more gross hematuria and more urinary frequency and dysuria than before but denies fever/chills/nausea/vomiting. Denies any other associated signs/symptoms or alleviating/aggravating factors. Due to upcoming Holiday, patient is interested in surgical intervention w/ ESWL in the next 2 weeks.   Past Medical History Problems  1. History of Heartburn With Regurgitation (787.1) 2. 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  History of Corneal LASIK 2. History of Cystoscopy With Ureteroscopy With Lithotripsy  Current Meds 1. Atenolol 25 MG Oral Tablet;  Therapy: (Recorded:15May2014) to Recorded 2. Desipramine HCl TABS;  Therapy: (Recorded:18Nov2014) to Recorded 3. Flurbiprofen 100 MG Oral Tablet;  Therapy: (Recorded:18Nov2014) to Recorded 4. Hydrocodone-Acetaminophen 7.5-325 MG Oral Tablet; TAKE 1 TO 2 TABLETS EVERY 4  TO 6 HOURS AS NEEDED FOR PAIN;  Therapy: 15May2014 to (Evaluate:29Nov2014); Last Rx:25Nov2014 Ordered 5. Meloxicam 15 MG Oral Tablet; TAKE 1 TABLET  DAILY WITH FOOD;  Therapy: 18Nov2014 to (Evaluate:16Dec2014)  Requested for: 18Nov2014; Last  Rx:18Nov2014 Ordered 6. Naratriptan HCl - 2.5 MG Oral Tablet;  Therapy: (Recorded:15May2014) to Recorded 7. Ondansetron 4 MG Oral Tablet Dispersible; TAKE 4 MG Every 6 hours;  Therapy: 25Nov2014 to (Last Rx:25Nov2014) Ordered 8. PriLOSEC 20 MG Oral Capsule Delayed Release;  Therapy: (Recorded:08Sep2011) to Recorded 9. Tamsulosin HCl - 0.4 MG Oral Capsule; TAKE 1 CAPSULE Daily;  Therapy: 18Nov2014 to (Evaluate:18Dec2014)  Requested for: 18Nov2014; Last  Rx:18Nov2014 Ordered  Allergies Medication  1. Sprix SOLN  Family History Problems  1. Family history of Hematuria : Father 2. Family history of Nephrolithiasis : Father 3. Family history of Nephrolithiasis : Mother 4. Family history of Prostate Cancer (Z61.09) : Father 5. Family history of Stroke Syndrome (V17.1) : Father  Social History Problems  1. Alcohol Use 2. Caffeine Use 3. Marital History - Currently Married 4. Never A Smoker  Review of Systems Genitourinary system(s) were reviewed and pertinent findings if present are noted.  Genitourinary: urinary frequency, feelings of urinary urgency, dysuria and hematuria, but no nocturia, no incontinence, no difficulty starting the urinary stream and initiating urination does not require straining.  Gastrointestinal: flank pain, but no nausea, no vomiting, no abdominal pain, no diarrhea and no constipation.  Constitutional: no fever.    Vitals Vital Signs [Data Includes: Last 1 Day]  Recorded: 09Dec2014 08:38AM  Blood Pressure: 116 / 73 Temperature: 98.3 F Heart Rate: 88  Physical Exam Constitutional: Well nourished and well developed.  ENT:. The ears and nose are normal in appearance.  Pulmonary: No respiratory distress.  Cardiovascular:. The arterial pulses are normal.  Abdomen: The abdomen is soft and nontender.  Neuro/Psych:. Mood and affect are appropriate.     Results/Data  19 Apr 2013 8:13 AM  UA With REFLEX    COLOR YELLOW     APPEARANCE CLEAR     SPECIFIC GRAVITY 1.025     pH 6.5     GLUCOSE NEG     BILIRUBIN NEG     KETONE NEG     BLOOD LARGE     PROTEIN TRACE     UROBILINOGEN 0.2     NITRITE NEG     LEUKOCYTE ESTERASE TRACE     SQUAMOUS EPITHELIAL/HPF NONE SEEN     WBC 3-6     CRYSTALS NONE SEEN     CASTS NONE SEEN     RBC TNTC     BACTERIA FEW   The following images/tracing/specimen were independently visualized:  KUB: stable 5mm right and 6mm left ureteral stones. Right ureteral stone seems to have moved distally about 2-3cm.  The following clinical lab reports were reviewed:  UA + TNCT RBC but no bacteria or RBC.    Assessment Assessed  1. Bilateral flank pain (789.09) 2. Calculus of ureter (592.1) 3. Calculus of right ureter (592.1)  KUB and previous CT results were discussed w/ Dr. Patsi Sears, confirming the locations of the stones in both ureters and treatment  plan. UA does not look infected but now w/ more RBC than previous UA. Patient is afebrile and pain is currently tolerable.   Plan Left flank pain, Nephrolithiasis, Right flank pain  1. Start: Uribel 118 MG Oral Capsule; TAKE 1 CAPSULE 4 times daily 2. Follow-up Office  Follow-up to schedule for ESWL  Status: Hold For - Appointment,Date of  Service  Requested for: 09Dec2014  Discussion/Summary Due to bilateral hydronephrosis noted on previous renal US and stable presence of bilateral stones, unchanged after 3 weeks of MET, the following treatment plan was discussed with patient:  - To OR for bilateral RPG and bilateral ureteral stents placement and left ureteroscopy stone removal w/ Holmium laser and basket of stone. Since the left ureteral stone is 6mm and has been present for 3 weeks, this will be the stone that will be removed. Relayed Dr. Imelda Pillow msg to patient that ESWL is not a good option for stone removal due to location of the stone.  - Return to  office 3-4 days later to have the left ureteral stent removal in office.  - Return for f/u in 2- weeks for repeat KUB to assess for movement of the right ureteral stone. If stone still present, then patient would need to return to OR for the right ureteral stone removal.  - He will continue w/ MET and strain for stone throughout this time period of stent and procedure.  - Treatment plans above were explained to patient in details and patient is agreeable to plan. Surgery form completed and given to Dr. Imelda Pillow OR scheduler.     Signatures Electronically signed by : Seward Grater, ANP-C; Apr 20 2013  7:19AM EST

## 2013-04-29 NOTE — Anesthesia Postprocedure Evaluation (Signed)
  Anesthesia Post-op Note  Patient: Raymond Chandler  Procedure(s) Performed: Procedure(s) (LRB): CYSTOSCOPY/BILATERAL RETROGRADE LEFT URETEROSCOPY/STONE REMOVAL WITH HOLMIUM LASER AND DIGITAL URETEROSCOPE    (Left) HOLMIUM LASER AND DIGITAL URETEROSCOPE  (Left)  Patient Location: PACU  Anesthesia Type: General  Level of Consciousness: awake and alert   Airway and Oxygen Therapy: Patient Spontanous Breathing  Post-op Pain: mild  Post-op Assessment: Post-op Vital signs reviewed, Patient's Cardiovascular Status Stable, Respiratory Function Stable, Patent Airway and No signs of Nausea or vomiting  Last Vitals:  Filed Vitals:   04/29/13 0926  BP: 113/72  Pulse: 70  Temp: 36 C  Resp: 14    Post-op Vital Signs: stable   Complications: No apparent anesthesia complications

## 2013-05-02 ENCOUNTER — Encounter (HOSPITAL_BASED_OUTPATIENT_CLINIC_OR_DEPARTMENT_OTHER): Payer: Self-pay | Admitting: Urology

## 2013-05-26 ENCOUNTER — Other Ambulatory Visit: Payer: Self-pay | Admitting: Urology

## 2013-05-27 ENCOUNTER — Encounter (HOSPITAL_BASED_OUTPATIENT_CLINIC_OR_DEPARTMENT_OTHER): Payer: Self-pay | Admitting: *Deleted

## 2013-05-27 NOTE — Progress Notes (Signed)
NPO AFTER MN. ARRIVE AT 0800. NEEDS HG. WILL TAKE ATENOLOL, NEXIUM, AND TOPAMAX AM DOS W/ SIPS OF WATER. IF NEEDED MAY TAKE ONE TYPE OF PAIN RX.

## 2013-06-03 ENCOUNTER — Encounter (HOSPITAL_BASED_OUTPATIENT_CLINIC_OR_DEPARTMENT_OTHER)
Admission: RE | Disposition: A | Discharge status: Home / Self Care | Payer: Self-pay | Source: Ambulatory Visit | Attending: Urology

## 2013-06-03 ENCOUNTER — Ambulatory Visit (HOSPITAL_BASED_OUTPATIENT_CLINIC_OR_DEPARTMENT_OTHER): Payer: BC Managed Care – PPO | Admitting: Anesthesiology

## 2013-06-03 ENCOUNTER — Ambulatory Visit (HOSPITAL_BASED_OUTPATIENT_CLINIC_OR_DEPARTMENT_OTHER)
Admission: RE | Admit: 2013-06-03 | Discharge: 2013-06-03 | Disposition: A | Discharge status: Home / Self Care | Payer: BC Managed Care – PPO | Source: Ambulatory Visit | Attending: Urology | Admitting: Urology

## 2013-06-03 ENCOUNTER — Encounter (HOSPITAL_BASED_OUTPATIENT_CLINIC_OR_DEPARTMENT_OTHER): Payer: BC Managed Care – PPO | Admitting: Anesthesiology

## 2013-06-03 ENCOUNTER — Encounter (HOSPITAL_BASED_OUTPATIENT_CLINIC_OR_DEPARTMENT_OTHER): Payer: Self-pay

## 2013-06-03 DIAGNOSIS — Q549 Hypospadias, unspecified: Secondary | ICD-10-CM | POA: Insufficient documentation

## 2013-06-03 DIAGNOSIS — N1330 Unspecified hydronephrosis: Secondary | ICD-10-CM | POA: Insufficient documentation

## 2013-06-03 DIAGNOSIS — R12 Heartburn: Secondary | ICD-10-CM | POA: Insufficient documentation

## 2013-06-03 DIAGNOSIS — R609 Edema, unspecified: Secondary | ICD-10-CM | POA: Insufficient documentation

## 2013-06-03 DIAGNOSIS — N209 Urinary calculus, unspecified: Secondary | ICD-10-CM

## 2013-06-03 DIAGNOSIS — G43909 Migraine, unspecified, not intractable, without status migrainosus: Secondary | ICD-10-CM | POA: Insufficient documentation

## 2013-06-03 DIAGNOSIS — E291 Testicular hypofunction: Secondary | ICD-10-CM | POA: Insufficient documentation

## 2013-06-03 DIAGNOSIS — Z87442 Personal history of urinary calculi: Secondary | ICD-10-CM | POA: Insufficient documentation

## 2013-06-03 DIAGNOSIS — Z79899 Other long term (current) drug therapy: Secondary | ICD-10-CM | POA: Insufficient documentation

## 2013-06-03 DIAGNOSIS — N201 Calculus of ureter: Secondary | ICD-10-CM | POA: Insufficient documentation

## 2013-06-03 DIAGNOSIS — E237 Disorder of pituitary gland, unspecified: Secondary | ICD-10-CM | POA: Insufficient documentation

## 2013-06-03 HISTORY — PX: CYSTOSCOPY WITH STENT PLACEMENT: SHX5790

## 2013-06-03 HISTORY — PX: HOLMIUM LASER APPLICATION: SHX5852

## 2013-06-03 HISTORY — PX: CYSTOSCOPY W/ URETERAL STENT REMOVAL: SHX1430

## 2013-06-03 HISTORY — DX: Urgency of urination: R39.15

## 2013-06-03 HISTORY — PX: CYSTOSCOPY WITH RETROGRADE PYELOGRAM, URETEROSCOPY AND STENT PLACEMENT: SHX5789

## 2013-06-03 LAB — POCT HEMOGLOBIN-HEMACUE: HEMOGLOBIN: 15.5 g/dL (ref 13.0–17.0)

## 2013-06-03 SURGERY — CYSTOURETEROSCOPY, WITH RETROGRADE PYELOGRAM AND STENT INSERTION
Anesthesia: General | Site: Ureter | Laterality: Right

## 2013-06-03 MED ORDER — URELLE 81 MG PO TABS
1.0000 | ORAL_TABLET | Freq: Four times a day (QID) | ORAL | Status: DC
  Filled 2013-06-03: qty 1

## 2013-06-03 MED ORDER — ONDANSETRON HCL 4 MG/2ML IJ SOLN
INTRAMUSCULAR | Status: DC | PRN
  Administered 2013-06-03: 4 mg via INTRAVENOUS

## 2013-06-03 MED ORDER — MIDAZOLAM HCL 2 MG/2ML IJ SOLN
INTRAMUSCULAR | Status: AC
  Filled 2013-06-03: qty 2

## 2013-06-03 MED ORDER — HYDROCHLOROTHIAZIDE 12.5 MG PO TABS: 12.5000 mg | ORAL_TABLET | Freq: Every day | ORAL | Status: DC

## 2013-06-03 MED ORDER — FENTANYL CITRATE 0.05 MG/ML IJ SOLN
INTRAMUSCULAR | Status: AC
  Filled 2013-06-03: qty 6

## 2013-06-03 MED ORDER — HYDROMORPHONE HCL PF 1 MG/ML IJ SOLN
0.2500 mg | INTRAMUSCULAR | Status: DC | PRN
  Filled 2013-06-03: qty 1

## 2013-06-03 MED ORDER — OXYCODONE HCL 5 MG/5ML PO SOLN
5.0000 mg | Freq: Once | ORAL | Status: DC | PRN
  Filled 2013-06-03: qty 5

## 2013-06-03 MED ORDER — DEXAMETHASONE SODIUM PHOSPHATE 4 MG/ML IJ SOLN
INTRAMUSCULAR | Status: DC | PRN
  Administered 2013-06-03: 10 mg via INTRAVENOUS

## 2013-06-03 MED ORDER — OXYCODONE HCL 5 MG PO TABS
5.0000 mg | ORAL_TABLET | Freq: Once | ORAL | Status: DC | PRN
  Filled 2013-06-03: qty 1

## 2013-06-03 MED ORDER — CEFAZOLIN SODIUM 1-5 GM-% IV SOLN
1.0000 g | INTRAVENOUS | Status: DC
  Filled 2013-06-03: qty 50

## 2013-06-03 MED ORDER — BELLADONNA ALKALOIDS-OPIUM 16.2-60 MG RE SUPP
RECTAL | Status: AC
Start: 2013-06-03 — End: 2013-06-03
  Filled 2013-06-03: qty 1

## 2013-06-03 MED ORDER — CEFAZOLIN SODIUM-DEXTROSE 2-3 GM-% IV SOLR
2.0000 g | INTRAVENOUS | Status: AC
  Administered 2013-06-03: 2 g via INTRAVENOUS
  Filled 2013-06-03: qty 50

## 2013-06-03 MED ORDER — MIDAZOLAM HCL 5 MG/5ML IJ SOLN
INTRAMUSCULAR | Status: DC | PRN
  Administered 2013-06-03: 2 mg via INTRAVENOUS

## 2013-06-03 MED ORDER — SODIUM CHLORIDE 0.9 % IR SOLN
Status: DC | PRN
  Administered 2013-06-03: 5000 mL

## 2013-06-03 MED ORDER — FENTANYL CITRATE 0.05 MG/ML IJ SOLN
INTRAMUSCULAR | Status: DC | PRN
  Administered 2013-06-03: 25 ug via INTRAVENOUS
  Administered 2013-06-03: 50 ug via INTRAVENOUS
  Administered 2013-06-03 (×2): 25 ug via INTRAVENOUS

## 2013-06-03 MED ORDER — URELLE 81 MG PO TABS: 1.0000 | ORAL_TABLET | Freq: Three times a day (TID) | ORAL | Status: DC

## 2013-06-03 MED ORDER — ALLOPURINOL 100 MG PO TABS: 100.0000 mg | ORAL_TABLET | Freq: Every day | ORAL | Status: AC

## 2013-06-03 MED ORDER — LIDOCAINE HCL (CARDIAC) 20 MG/ML IV SOLN
INTRAVENOUS | Status: DC | PRN
  Administered 2013-06-03: 60 mg via INTRAVENOUS

## 2013-06-03 MED ORDER — PROPOFOL 10 MG/ML IV BOLUS
INTRAVENOUS | Status: DC | PRN
  Administered 2013-06-03: 200 mg via INTRAVENOUS

## 2013-06-03 MED ORDER — LACTATED RINGERS IV SOLN
INTRAVENOUS | Status: DC
  Administered 2013-06-03 (×2): via INTRAVENOUS
  Filled 2013-06-03: qty 1000

## 2013-06-03 MED ORDER — ACETAMINOPHEN 10 MG/ML IV SOLN
INTRAVENOUS | Status: DC | PRN
  Administered 2013-06-03: 1000 mg via INTRAVENOUS

## 2013-06-03 MED ORDER — BELLADONNA ALKALOIDS-OPIUM 16.2-60 MG RE SUPP
RECTAL | Status: DC | PRN
  Administered 2013-06-03: 1 via RECTAL

## 2013-06-03 MED ORDER — IOHEXOL 350 MG/ML SOLN
INTRAVENOUS | Status: DC | PRN
  Administered 2013-06-03: 10 mL

## 2013-06-03 MED ORDER — KETOROLAC TROMETHAMINE 30 MG/ML IJ SOLN
INTRAMUSCULAR | Status: DC | PRN
  Administered 2013-06-03: 30 mg via INTRAVENOUS

## 2013-06-03 MED ORDER — OXYCODONE-ACETAMINOPHEN 5-325 MG PO TABS: 1.0000 | ORAL_TABLET | ORAL | Status: DC | PRN

## 2013-06-03 MED ORDER — PROMETHAZINE HCL 25 MG/ML IJ SOLN
6.2500 mg | INTRAMUSCULAR | Status: DC | PRN
  Filled 2013-06-03: qty 1

## 2013-06-03 MED ORDER — MEPERIDINE HCL 25 MG/ML IJ SOLN
6.2500 mg | INTRAMUSCULAR | Status: DC | PRN
  Filled 2013-06-03: qty 1

## 2013-06-03 SURGICAL SUPPLY — 39 items
ADAPTER CATH URET PLST 4-6FR (CATHETERS) IMPLANT
BAG DRAIN URO-CYSTO SKYTR STRL (DRAIN) ×3 IMPLANT
BASKET LASER NITINOL 1.9FR (BASKET) IMPLANT
BASKET STNLS GEMINI 4WIRE 3FR (BASKET) IMPLANT
BASKET ZERO TIP NITINOL 2.4FR (BASKET) ×3 IMPLANT
BOOTIES KNEE HIGH SLOAN (MISCELLANEOUS) ×3 IMPLANT
BRUSH URET BIOPSY 3F (UROLOGICAL SUPPLIES) IMPLANT
CANISTER SUCT LVC 12 LTR MEDI- (MISCELLANEOUS) ×3 IMPLANT
CATH CLEAR GEL 3F BACKSTOP (CATHETERS) ×3 IMPLANT
CATH INTERMIT  6FR 70CM (CATHETERS) ×3 IMPLANT
CATH URET 5FR 28IN CONE TIP (BALLOONS)
CATH URET 5FR 28IN OPEN ENDED (CATHETERS) IMPLANT
CATH URET 5FR 70CM CONE TIP (BALLOONS) IMPLANT
CATH URET DUAL LUMEN 6-10FR 50 (CATHETERS) IMPLANT
CLOTH BEACON ORANGE TIMEOUT ST (SAFETY) ×3 IMPLANT
DRAPE CAMERA CLOSED 9X96 (DRAPES) ×3 IMPLANT
ELECT REM PT RETURN 9FT ADLT (ELECTROSURGICAL)
ELECTRODE REM PT RTRN 9FT ADLT (ELECTROSURGICAL) IMPLANT
GLOVE BIO SURGEON STRL SZ7 (GLOVE) ×3 IMPLANT
GLOVE BIOGEL M 6.5 STRL (GLOVE) ×9 IMPLANT
GOWN PREVENTION PLUS LG XLONG (DISPOSABLE) IMPLANT
GOWN STRL REIN XL XLG (GOWN DISPOSABLE) IMPLANT
GOWN STRL REUS W/TWL LRG LVL3 (GOWN DISPOSABLE) ×3 IMPLANT
GOWN STRL REUS W/TWL XL LVL3 (GOWN DISPOSABLE) ×3 IMPLANT
GUIDEWIRE 0.038 PTFE COATED (WIRE) IMPLANT
GUIDEWIRE ANG ZIPWIRE 038X150 (WIRE) IMPLANT
GUIDEWIRE STR DUAL SENSOR (WIRE) ×3 IMPLANT
IV NS 1000ML (IV SOLUTION) ×2
IV NS 1000ML BAXH (IV SOLUTION) ×4 IMPLANT
IV NS IRRIG 3000ML ARTHROMATIC (IV SOLUTION) ×3 IMPLANT
KIT BALLIN UROMAX 15FX10 (LABEL) IMPLANT
KIT BALLN UROMAX 15FX4 (MISCELLANEOUS) IMPLANT
KIT BALLN UROMAX 26 75X4 (MISCELLANEOUS)
LASER FIBER DISP (UROLOGICAL SUPPLIES) ×3 IMPLANT
POLARIS LOOP IMPLANT
SET HIGH PRES BAL DIL (LABEL)
SHEATH ACCESS URETERAL 38CM (SHEATH) IMPLANT
SHEATH ACCESS URETERAL 54CM (SHEATH) IMPLANT
STENT POLARIS 5FRX24 (STENTS) ×3 IMPLANT

## 2013-06-03 NOTE — Anesthesia Postprocedure Evaluation (Signed)
Anesthesia Post Note  Patient: Raymond Chandler  Procedure(s) Performed: Procedure(s) (LRB): CYSTOSCOPY WITH RETROGRADE PYELOGRAM, URETEROSCOPY , BACKSTOP APPLICATION, STONE RETRIEVED (Right) HOLMIUM LASER APPLICATION (Right) CYSTOSCOPY WITH STENT REMOVAL (Right) CYSTOSCOPY WITH STENT PLACEMENT  Anesthesia type: General  Patient location: PACU  Post pain: Pain level controlled  Post assessment: Post-op Vital signs reviewed  Last Vitals: BP 124/74  Pulse 63  Temp(Src) 36.5 C (Oral)  Resp 15  Ht 5\' 9"  (1.753 m)  Wt 198 lb (89.812 kg)  BMI 29.23 kg/m2  SpO2 100%  Post vital signs: Reviewed  Level of consciousness: sedated  Complications: No apparent anesthesia complications

## 2013-06-03 NOTE — Transfer of Care (Signed)
Immediate Anesthesia Transfer of Care Note  Patient: Raymond Chandler  Procedure(s) Performed: Procedure(s) (LRB): CYSTOSCOPY WITH RETROGRADE PYELOGRAM, URETEROSCOPY , BACKSTOP APPLICATION, STONE RETRIEVED (Right) HOLMIUM LASER APPLICATION (Right) CYSTOSCOPY WITH STENT REMOVAL (Right) CYSTOSCOPY WITH STENT PLACEMENT  Patient Location: PACU  Anesthesia Type: General  Level of Consciousness: awake, oriented, sedated and patient cooperative  Airway & Oxygen Therapy: Patient Spontanous Breathing and Patient connected to face mask oxygen  Post-op Assessment: Report given to PACU RN and Post -op Vital signs reviewed and stable  Post vital signs: Reviewed and stable  Complications: No apparent anesthesia complications 

## 2013-06-03 NOTE — Anesthesia Procedure Notes (Signed)
Procedure Name: LMA Insertion Date/Time: 06/03/2013 9:32 AM Performed by: Renella CunasHAZEL, Osten Janek D Pre-anesthesia Checklist: Patient identified, Emergency Drugs available, Suction available and Patient being monitored Patient Re-evaluated:Patient Re-evaluated prior to inductionOxygen Delivery Method: Circle System Utilized Preoxygenation: Pre-oxygenation with 100% oxygen Intubation Type: IV induction Ventilation: Mask ventilation without difficulty LMA: LMA inserted LMA Size: 4.0 Number of attempts: 1 Airway Equipment and Method: bite block Placement Confirmation: positive ETCO2 Tube secured with: Tape Dental Injury: Teeth and Oropharynx as per pre-operative assessment

## 2013-06-03 NOTE — Anesthesia Preprocedure Evaluation (Signed)
Anesthesia Evaluation  Patient identified by MRN, date of birth, ID band Patient awake    Reviewed: Allergy & Precautions, H&P , NPO status , Patient's Chart, lab work & pertinent test results  Airway Mallampati: II TM Distance: >3 FB Neck ROM: Full    Dental no notable dental hx. (+) Teeth Intact   Pulmonary Current Smoker,  breath sounds clear to auscultation  Pulmonary exam normal       Cardiovascular negative cardio ROS  Rhythm:Regular Rate:Normal     Neuro/Psych  Headaches, negative psych ROS   GI/Hepatic Neg liver ROS, GERD-  Medicated and Controlled,  Endo/Other  negative endocrine ROS  Renal/GU negative Renal ROS     Musculoskeletal negative musculoskeletal ROS (+)   Abdominal   Peds  Hematology negative hematology ROS (+)   Anesthesia Other Findings   Reproductive/Obstetrics                           Anesthesia Physical  Anesthesia Plan  ASA: II  Anesthesia Plan: General   Post-op Pain Management:    Induction: Intravenous  Airway Management Planned: LMA  Additional Equipment:   Intra-op Plan:   Post-operative Plan: Extubation in OR  Informed Consent: I have reviewed the patients History and Physical, chart, labs and discussed the procedure including the risks, benefits and alternatives for the proposed anesthesia with the patient or authorized representative who has indicated his/her understanding and acceptance.   Dental advisory given  Plan Discussed with: CRNA  Anesthesia Plan Comments:         Anesthesia Quick Evaluation

## 2013-06-03 NOTE — Discharge Instructions (Addendum)
Kidney Stones Kidney stones (urolithiasis) are solid masses that form inside your kidneys. The intense pain is caused by the stone moving through the kidney, ureter, bladder, and urethra (urinary tract). When the stone moves, the ureter starts to spasm around the stone. The stone is usually passed in your pee (urine).  HOME CARE  Drink enough fluids to keep your pee clear or pale yellow. This helps to get the stone out.  Strain all pee through the provided strainer. Do not pee without peeing through the strainer, not even once. If you pee the stone out, catch it in the strainer. The stone may be as small as a grain of salt. Take this to your doctor. This will help your doctor figure out what you can do to try to prevent more kidney stones.  Only take medicine as told by your doctor.  Follow up with your doctor as told.  Get follow-up X-rays as told by your doctor. GET HELP IF: You have pain that gets worse even if you have been taking pain medicine. GET HELP RIGHT AWAY IF:   Your pain does not get better with medicine.  You have a fever or shaking chills.  Your pain increases and gets worse over 18 hours.  You have new belly (abdominal) pain.  You feel faint or pass out.  You are unable to pee. MAKE SURE YOU:   Understand these instructions.  Will watch your condition.  Will get help right away if you are not doing well or get worse. Document Released: 10/15/2007 Document Revised: 12/29/2012 Document Reviewed: 09/29/2012 [facility] Patient Information 2014 [place], [place].  Diet for Kidney Stones Kidney stones are small, hard masses that form inside your kidneys. They are made up of salts and minerals and often form when high levels build up in the urine. The minerals can then start to build up, crystalize, and stick together to form stones. There are several different types of kidney stones. The following types of stones may be influenced by dietary factors:   Calcium Oxalate  Stones. An oxalate is a salt found in certain foods. Within the body, calcium can combine with oxalates to form calcium oxalate stones, which can be excreted in the urine in high amounts. This is the most common type of kidney stone.  Calcium Phosphate Stones. These stones may occur when the pH of the urine becomes too high, or less acidic, from too much calcium being excreted in the urine. The pH is a measure of how acidic or basic a substance is.  Uric Acid Stones. This type of stone occurs when the pH of the urine becomes too low, or very acidic, because substances called purines build up in the urine. Purines are found in animal proteins. When the urine is highly concentrated with acid, uric acid kidney stones can form.  Other risk factors for kidney stones include genetics, environment, and being overweight. Your caregiver may ask you to follow specific diet guidelines based on the type of stone you have to lessen the chances of your body making more kidney stones.  GENERAL GUIDELINES FOR ALL TYPES OF STONES  Drink plenty of fluid. Drink 12 16 cups of fluid a day, drinking mainly water.This is the most important thing you can do to prevent the formation of future kidney stones.  Maintain a healthy weight. Your caregiver or dietitian can help you determine what a healthy weight is for you. If you are overweight, weight loss may help prevent the formation of future  kidney stones.  Eat a diet adequate in animal protein. Too much animal protein can contribute to the formation of stones. Your dietitian can help you determine how much protein you should be eating. Avoid low carbohydrate, high protein diets.  Follow a balanced eating approach. The DASH diet, which stands for "Dietary Approaches to Stop Hypertension," is an effective meal plan for reducing stone formation. This diet is high in fruits, vegetables, dairy, and whole grains and low in animal protein. Ask your caregiver or dietitian for  information about the DASH diet. ADDITIONAL DIET GUIDELINES FOR CALCIUM STONES Avoid foods high in salt. This includes table salt, salt seasonings, MSG, soy sauce, cured and processed meats, salted crackers and snack foods, fast food, and canned soups and foods. Ask your caregiver or dietitian for information about reducing sodium in your diet or following the low sodium diet.  Ensure adequate calcium intake. Use the following table for calcium guidelines:  Men 104 years old and younger  1000 mg/day.  Men 70 years old and older  1500 mg/day.  Women 72 32 years old  1000 mg/day.  Women 50 years and older  1500 mg/day. Your dietitian can help you determine if you are getting enough calcium in your diet. Foods that are high in calcium include dairy products, broccoli, cheese, yogurt, and pudding. If you need to take a calcium supplement, take it only in the form of calcium citrate.  Avoid foods high in oxalate. Be sure that any supplements you take do not contain more than 500 mg of vitamin C. Vitamin C is converted into oxalate in the body. You do not need to avoid fruits and vegetables high in vitamin C.   Grains: High-fiber or bran cereal, whole-wheat bread, grits, barley, buckwheat, amaranth, pretzels, and fruitcake.  Vegetables: Dried beans, wax beans, dark leafy greens, eggplant, leeks, okra, parsley, rutabaga, tomato paste, watercress, zucchini, and escarole.  Fruit: Dried apricots, red currants, figs, kiwi, and rhubarb.  Meat and Meat Substitutes: Soybeans and foods made from soy (soyburger, miso), dried beans, peanut butter.  Milk: Chocolate milk mixes and soymilk.  Fats and Oils: Nuts (peanuts, almonds, pecans, cashews, hazelnuts) and nut butters, sesame seeds, and tDahini paste.  Condiments/Miscellaneous: Chocolate, carob, marmalade, poppy seeds, instant iced tea, and juice from high-oxalate fruits.   Post Anesthesia Home Care Instructions  Activity: Get plenty of rest for  the remainder of the day. A responsible adult should stay with you for 24 hours following the procedure.  For the next 24 hours, DO NOT: -Drive a car -Advertising copywriter -Drink alcoholic beverages -Take any medication unless instructed by your physician -Make any legal decisions or sign important papers.  Meals: Start with liquid foods such as gelatin or soup. Progress to regular foods as tolerated. Avoid greasy, spicy, heavy foods. If nausea and/or vomiting occur, drink only clear liquids until the nausea and/or vomiting subsides. Call your physician if vomiting continues.  Special Instructions/Symptoms: Your throat may feel dry or sore from the anesthesia or the breathing tube placed in your throat during surgery. If this causes discomfort, gargle with warm salt water. The discomfort should disappear within 24 hours. [facility] [phone] Post Ureteroscopy With or Without Stent Instructions  Definitions:  Ureter: The duct that transports urine from the kidney to the bladder. Stent:   A plastic hollow tube that is placed into the ureter, from the kidney to the  bladder to prevent the ureter from swelling shut.  GENERAL INSTRUCTIONS:  Despite the fact that no skin incisions were used, the area around the ureter and bladder is raw and irritated. The stent is a foreign body which will further irritate the bladder wall. This irritation is manifested by increased frequency of urination, both day and night, and by an increase in the urge to urinate. In some, the urge to urinate is present almost always. Sometimes the urge is strong enough that you may not be able to stop yourself from urinating. The only real cure is to remove the stent and then give time for the bladder wall to heal which can't be done until the danger of the ureter swelling shut has passed, which varies.  You may see some blood in your urine while the stent is in place and a few days  afterwards. Do not be alarmed, even if the urine was clear for a while. Get off your feet and drink lots of fluids until clearing occurs. If you start to pass clots or don't improve, call us.  DIET: You may return to your normal diet immediately. Because of the raw surface of your bladder, alcohol, spicy foods, acid type foods and drinks with caffeine may cause irritation or frequency and should be used in moderation. To keep your urine flowing freely and to avoid constipation, drink plenty of fluids during the day ( 8-10 glasses ). Tip: Avoid cranberry juice because it is very acidic.  ACTIVITY: Your physical activity doesn't need to be restricted. However, if you are very active, you may see some blood in your urine. We suggest that you reduce your activity under these circumstances until the bleeding has stopped.  BOWELS: It is important to keep your bowels regular during the postoperative period. Straining with bowel movements can cause bleeding. A bowel movement every other day is reasonable. Use a mild laxative if needed, such as Milk of Magnesia 2-3 tablespoons, or 2 Dulcolax tablets. Call if you continue to have problems. If you have been taking narcotics for pain, before, during or after your surgery, you may be constipated. Take a laxative if necessary.   MEDICATION: You should resume your pre-surgery medications unless told not to. In addition you will often be given an antibiotic to prevent infection. These should be taken as prescribed until the bottles are finished unless you are having an unusual reaction to one of the drugs.  PROBLEMS YOU SHOULD REPORT TO US:  Fevers over 100.5 Fahrenheit.  Heavy bleeding, or clots ( See above notes about blood in urine ).  Inability to urinate.  Drug reactions ( hives, rash, nausea, vomiting, diarrhea ).  Severe burning or pain with urination that is not improving.  FOLLOW-UP: You will need a follow-up appointment to monitor your  progress. Call for this appointment at the number listed above. Usually the first appointment will be about three to fourteen days after your surgery.     Document Released: 08/23/2010 Document Revised: 10/28/2011 Document Reviewed: 10/13/2011 [facility] Patient Information 2014 [place].

## 2013-06-03 NOTE — Transfer of Care (Deleted)
Immediate Anesthesia Transfer of Care Note  Patient: Raymond Chandler  Procedure(s) Performed: Procedure(s) (LRB): CYSTOSCOPY WITH RETROGRADE PYELOGRAM, URETEROSCOPY , BACKSTOP APPLICATION, STONE RETRIEVED (Right) HOLMIUM LASER APPLICATION (Right) CYSTOSCOPY WITH STENT REMOVAL (Right) CYSTOSCOPY WITH STENT PLACEMENT  Patient Location: PACU  Anesthesia Type: General  Level of Consciousness: awake, oriented, sedated and patient cooperative  Airway & Oxygen Therapy: Patient Spontanous Breathing and Patient connected to face mask oxygen  Post-op Assessment: Report given to PACU RN and Post -op Vital signs reviewed and stable  Post vital signs: Reviewed and stable  Complications: No apparent anesthesia complications

## 2013-06-03 NOTE — Interval H&P Note (Signed)
History and Physical Interval Note:  06/03/2013 8:36 AM  Raymond Chandler  has presented today for surgery, with the diagnosis of Right Ureteral Stone  The various methods of treatment have been discussed with the patient and family. After consideration of risks, benefits and other options for treatment, the patient has consented to  Procedure(s) with comments: CYSTOSCOPY WITH RETROGRADE PYELOGRAM, URETEROSCOPY AND STENT PLACEMENT (Right) - POSSIBLE RIGHT URETERAL STENT PLACEMENT     HOLMIUM LASER APPLICATION (Right) as a surgical intervention .  The patient's history has been reviewed, patient examined, no change in status, stable for surgery.  I have reviewed the patient's chart and labs.  Questions were answered to the patient's satisfaction.     Jethro BolusANNENBAUM, Annalei Friesz I

## 2013-06-03 NOTE — H&P (Signed)
History of Present Illness     Mr. Raymond Chandler is a 33 yr old male patient of Dr. Arlyn Leak w/ GU hx of kidney stones. He was originally referred from Dr. Drema Dallas for evaluation of nephrolithiasis. Has chronically passes numerous stones over the past several years ever since he was 33 yrs old. Kidney stone Pain is controlled with PRN Ibuprofen and Vicodin . ESWL 8+ years ago. + FH of nephrolithiasis in both parents. Admits to 2 caffeinated drinks/day, 1 ETOH/week, and will drinks large amounts of cranberry juice and beer to help pass stone. 01/18/10 labs: Uric acid - 6.6, Calcium - 9.9, and PTH - 41.0.  He is no longer taking Allopurinol 14m QD & HCTZ 240mQD.     Stone Timeline:    -  May 2014 for bilateral flank pain and CT stone protocol on 09/23/12 showed bilateral renal stones w/ 62m7mbstructing proximal left ureteral stone. He was started on MET, given Sprix and Vicodin for pain control but unable to take Sprix due to worsening of migraine. He was supposed to f/u in 3 weeks but never did because he was able to pass several stones during the interim. He had a UTI one week after he saw us [place] May and resolved w/ abx given by PCP.  -  Mar 30, 2013 : c/o 3-wk hx of left flank pain 7/10 radiates to left testicle. KUB noted 62mm13md left ureteral stone w/ mild-moderate hydronephrosis on renal US. [place] was started on MET to strain for stone, renewal of Vicodin, and Meloxicam added for testicular pain while d/c ibuprofen.   - Apr 05, 2013 : c/o 3-day hx of right flank pain. CT indicates new proximal 5mm 68mht ureteral stone and stable left 62mm m32mureteral stone. Flomax was changed to Rapaflo and pt to continue straining for stone.  - April 29, 2013: Underwent left ureteroscopy stone retrieval w/ Holmium laser and basket of stone and left ureteral stent placement in addition to right ureteral stent placement. He was unable to empty during recovery and had to had his blader drained w/ a catheter by  straight cath but no Foley cath needed. Retention issue post procedure has since resolved.  Dr. TannenGaynelle Arabiannable to visualize the right ureteral stone on the intraop xray.  - May 04, 2013: in office left ureteral stent removal and pt to continue w/ MET to help pass the remaining right ureteral stone w/ stent.    His PMH is significant for migraines which require a regimen of desprimaine 3x QHS and atenolol 2x in am and naratriptan PRN    Interval hx:  Mr. BartleChanthavongns today for f/u KUB for reassessment of right ureteral stone. He denies any pain or flank pain except for stent discomfort w/ voiding. He had metallic taste in his mouth while taking oxybutynin and pyridium but this has resolved after discontinuing these 2 meds. Uribel provides great relief w/ stent discomfort. He has not needed to take any pain meds since May 04, 2013. He would like to take medication to help dissolve the possible uric acid right ureteral stone since it was not seen on KUB during intraop.  Denies fever/chills/nausea/vomiting or any other associated signs/symptoms or alleviating/aggravating factors.   Past Medical History Problems  1. History of Heartburn With Regurgitation (787.1) 2. 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 History of  Corneal LASIK 2. History of Cystoscopy With Insertion Of Ureteral Stent Bilateral 3. History of Cystoscopy With Ureteroscopy With Lithotripsy 4. History of Cystoscopy With Ureteroscopy With Lithotripsy  Current Meds 1. Atenolol 25 MG Oral Tablet;  Therapy: (Recorded:15May2014) to Recorded 2. Desipramine HCl TABS;  Therapy: (Recorded:18Nov2014) to Recorded 3. Flurbiprofen 100 MG Oral Tablet;  Therapy: (Recorded:18Nov2014) to Recorded 4. Meloxicam 15 MG Oral Tablet; TAKE 1 TABLET  DAILY WITH FOOD;  Therapy: 85YIF0277 to (Evaluate:16Dec2014)  Requested for: [id number]; Last  Rx:18Nov2014 Ordered 5. Ondansetron 4 MG Oral Tablet Dispersible; TAKE 4 MG Every 6 hours;  Therapy: 72CNO7096 to (Last Rx:25Nov2014) Ordered 6. PriLOSEC 20 MG Oral Capsule Delayed Release;  Therapy: (Recorded:08Sep2011) to Recorded 7. Tamsulosin HCl - 0.4 MG Oral Capsule; TAKE 1 CAPSULE Daily;  Therapy: 28ZMO2947 to (Evaluate:18Dec2014)  Requested for: 65YYT0354; Last  Rx:18Nov2014 Ordered 8. Uribel 118 MG Oral Capsule; TAKE ONE CAPSULE BY MOUTH 4 TIMES A DAY;  Therapy: 65KCL2751 to (ZGYFVCBS:49QPR9163)  Requested for: 84YKZ9935; Last  Rx:08Jan2015 Ordered  Allergies Medication  1. Sprix SOLN  Family History Problems  1. Family history of Hematuria : Father 2. Family history of Nephrolithiasis : Father 3. Family history of Nephrolithiasis : Mother 4. Family history of Prostate Cancer (T01.77) : Father 5. Family history of Stroke Syndrome (V17.1) : Father  Social History Problems  1. Alcohol Use 2. Caffeine Use 3. Marital History - Currently Married 4. Never A Smoker  Review of Systems Genitourinary system(s) were reviewed and pertinent findings if present are noted.  Genitourinary: dysuria and suprapubic pain, but no urinary frequency, no feelings of urinary urgency and no hematuria.  Gastrointestinal: flank pain, but no nausea, no vomiting, no abdominal pain, no diarrhea and no constipation.  Constitutional: no fever.    Vitals Vital Signs [Data Includes: Last 1 Day]  Recorded: 93JQZ0092 08:17AM  Blood Pressure: 109 / 73 Temperature: 98.3 F Heart Rate: 80  Physical Exam Constitutional: Well nourished and well developed . No acute distress.  ENT:. The ears and nose are normal in appearance.  Pulmonary: No respiratory distress.  Cardiovascular:. The arterial pulses are normal.  Abdomen: The abdomen is soft and nontender.  Neuro/Psych:. Mood and affect are appropriate.     Results/Data Urine [Data Includes: Last 1 Day]   [id number]  COLOR YELLOW   APPEARANCE CLEAR   SPECIFIC GRAVITY 1.015   pH 6.5   GLUCOSE NEG mg/dL  BILIRUBIN NEG   KETONE NEG mg/dL  BLOOD TRACE   PROTEIN NEG mg/dL  UROBILINOGEN 0.2 mg/dL  NITRITE NEG   LEUKOCYTE ESTERASE NEG   SQUAMOUS EPITHELIAL/HPF RARE   WBC 0-2 WBC/hpf  RBC 3-6 RBC/hpf  BACTERIA RARE   CRYSTALS NONE SEEN   CASTS NONE SEEN   Other MUCUS NOTED    The following images/tracing/specimen were independently visualized:  KUB : stable bilateral renal stones. Stable right mid ureteral stone and right ureteral stent in place.  The following clinical lab reports were reviewed:  UA + 3-6 RBC, rare bacteria, no WBC.    Assessment Assessed  1. Calculus of right ureter (592.1)  Today's KUB indicates stable right ureteral stone 79m in size and appropriate positioning of right ureteral stent. Stone has not moved when compared to KUB on Nov 2014. Patient is asymptomatic for flank pain except for stent discomfort w/ voiding when urine gets refluxed up onto the stent.   Plan  Health Maintenance  1. UA With REFLEX; [Do Not Release]; Status:Complete;   Done:: 33HLK562508:08AM Nephrolithiasis  2.  Follow-up Office  Follow-up pending discussion of stone removal next week  Status:  Hold For - Appointment,Date of Service  Requested for: [id number]  Discussion/Summary -  Patient was hoping that the current 33m mid right ureteral stone is uric acid type of stone that he may take medications to help dissolve it to reduce cost since the recent left ureteroscopy procedure was too costly for him. However, I informed patient that this stone is most likely not uric acid type of stone since it is seen clearly on KUB. This stone has been present since Nov 24 KUB and still has not moved. Therefore, he will need surgical intervention. Patient is agreeable to plan. Surgical scheduling form completed for cysto, right ureteroscopy, right holmium  laser removal of stone, and possible right ureteral stent placement.    - Continue empiric TMP 1079mdaily given to him by Dr. TaGaynelle Arabian  - Continue w/ UrLynnda ShieldsRN   Signatures Electronically signed by : NaSteele BergANP-C; May 24 2013  4:16PM EST

## 2013-06-03 NOTE — Op Note (Signed)
Pre-operative diagnosis :   Right upper ureteral calculus(7 mm, multifaceted)  Postoperative diagnosis:  Same  Operation:  Cystourethroscopy, removal of right double-J stent, right retrograde pyelogram with interpretation, right ureteroscopy, placement of backstop, basket extraction of right upper ureteral calculus, placement of Polaris 5 French by 24 cm stent.  Surgeon:  Kathie Rhodes. Patsi Sears, MD  First assistant:  None  Anesthesia:  General LMA  Preparation:  After appropriate preanesthesia, the patient was brought to the operating room, placed on the operating table in the dorsal supine position where general LMA anesthesia was introduced. He was replaced in the dorsal lithotomy position where the pubis was prepped with Betadine solution and draped in usual fashion. The arm will on the right side was previously marked with a blue marking pen. The history was reviewed. X-rays were reviewed.  Review history:  Bugarin is a 33 yr old male patient of Dr. Imelda Pillow w/ GU hx of kidney stones. He was originally referred from Dr. Zachery Dauer for evaluation of nephrolithiasis. Has chronically passes numerous stones over the past several years ever since he was 33 yrs old. Kidney stone Pain is controlled with PRN Ibuprofen and Vicodin . ESWL 8+ years ago. + FH of nephrolithiasis in both parents. Admits to 2 caffeinated drinks/day, 1 ETOH/week, and will drinks large amounts of cranberry juice and beer to help pass stone. 01/18/10 labs: Uric acid - 6.6, Calcium - 9.9, and PTH - 41.0. He is no longer taking Allopurinol 100mg  QD & HCTZ 25mg  QD.      Statement of  Likelihood of Success: Excellent. TIME-OUT observed.:  Procedure:  Examination the penis shows distal ventral hypospadias. No dilation was necessary. The cystoscope was placed within the urethra, and the proximal urethra was normal. The bladder neck was within normal limits. The bladder itself showed a normal trigone, with clear reflux from both orifices. On  the right side,, edema was noted around the ureteral orifice, and double-J stent was noted. The double-J stent was removed. There was no trabeculation, no stone identification, and no tumor was found.  A 6 French open-ended catheter was placed within the right ureteral orifice, which appeared to be dilated. Retrograde pyelogram showed proximal hydronephrosis. No stone could be identified, and the semirigid short ureteroscope was placed, and ureteroscopy revealed a multifaceted 7 mm stone in the upper ureter. The stone appeared to be hypermobile, and therefore, a catheter was inserted above the stone, and Backstop gel was inserted. A laser fiber was placed in the upper ureter, but the stone became hypermobile, and floated up into the gel. I was unable to manipulate the ureteroscope up to the level of gel, but was able to pass a 4 wire flat basket into the gel, and retrieved the stone. The stone was removed without difficulty. It was therefore felt that no laser was necessary. However, edema of the upper, and mid ureter was identified. Edema of the orifice was also noted. I elected to place a Polaris double-J stent, because of the patient's complaints with previous double-J stent. A 5 French double-J Polaris stent was then selected. This was placed over a guidewire, coiled in the renal pelvis under fluoroscopic control. It was released in the bladder. Suture was left on the end of the Polaris stent for easy removal. The patient will be instructed in self double-J stent removal.  The patient received IV Tylenol, IV Toradol, as well as a B. and O. suppository. He was awakened, and taken to recovery room in good condition. The stone was  taken for analysis.

## 2013-06-06 ENCOUNTER — Encounter (HOSPITAL_BASED_OUTPATIENT_CLINIC_OR_DEPARTMENT_OTHER): Payer: Self-pay | Admitting: Urology

## 2014-03-07 ENCOUNTER — Other Ambulatory Visit: Payer: Self-pay | Admitting: Neurology

## 2014-03-08 ENCOUNTER — Other Ambulatory Visit: Payer: Self-pay | Admitting: Neurology

## 2014-03-08 DIAGNOSIS — I651 Occlusion and stenosis of basilar artery: Secondary | ICD-10-CM

## 2014-03-09 ENCOUNTER — Other Ambulatory Visit: Payer: Self-pay | Admitting: Urology

## 2014-03-18 ENCOUNTER — Ambulatory Visit
Admission: RE | Admit: 2014-03-18 | Discharge: 2014-03-18 | Disposition: A | Discharge status: Home / Self Care | Payer: BC Managed Care – PPO | Source: Ambulatory Visit | Attending: Neurology | Admitting: Neurology

## 2014-03-18 DIAGNOSIS — I651 Occlusion and stenosis of basilar artery: Secondary | ICD-10-CM

## 2014-03-23 ENCOUNTER — Encounter (HOSPITAL_BASED_OUTPATIENT_CLINIC_OR_DEPARTMENT_OTHER): Payer: Self-pay | Admitting: *Deleted

## 2014-03-23 NOTE — Progress Notes (Signed)
NPO AFTER MN. ARRIVE AT [id number]. NEEDS HG. WILL TAKE NEXIUM AM DOS W/ SIPS OF WATER AND IF NEEDED TAKE HYDROCODONE.

## 2014-03-27 NOTE — H&P (Signed)
History of Present Illness This is a consultation for nephrolithiasis. He previously saw Dr. Patsi Searsannenbaum. I had previously seen the patient for erectile dysfunction.     He has a long history of kidney stones associated with intermittent bilateral flank pain and bilateral groin/testicular pain that alternates between sides. He has chronically passed numerous stones over the years. Prior ESWL. + FH of nephrolithiasis in both parents.     A metabolic evaluation in 2011 showed CaOx, Uric acid stones, Sept 2011 labs: Uric acid - 6.6, Calcium - 9.9, and PTH - 41.0. 24-hour urine showed borderline low volume and hypercalcemia. His citrate and uric acid were normal. He was started on hydrochlorothiazide and allopurinol as well as dietary changes to decrease beer and cranberry juice.    He underwent bilateral staged ureteroscopy which was completed January 2015.     KUB Aug 2015 showed stable bilateral stones.     I saw him August 2015 for erectile dysfunction. He had trouble getting and maintaining an erection for 8 months. His also had decreased sensation with erections. Not associated with decreased libido. He had not tried anything for it but said he would take "another pill". Not associated with cardiovascular risk factors, chest pain or nitroglycerin use. He has a h/o HTN on a beta blocker and desipramine which could certainly affect his sexual function. He was started on low-dose, 5 mg when necessary.    Today, he underwent KUB. I reviewed his KUB today and compared back to CT scan last month. KUB today shows bilateral renal stones. CT scan September 2015 shows about 7 stones in the right kidney the largest in the right lobe. 5. Scattered punctate stones in the left kidney with the largest stones in the left lower pole 3 mm and 6 mm. Stone burden slight increase in size since CT Nov 2014 when the patient underwent bilateral ureteroscopy for bilateral ureteral stones.    He has no pain  today but gets occasional low back pain and testicular pain. He is on allopurinol and hydrochlorothiazide for stone management. He watches his diet. He does eat some calcium.     He tried Cialis 5 mg for erections. It worked well but he had a headache for 3 days.   Past Medical History Problems  1. History of Heartburn With Regurgitation 2. History of Hypogonadism, testicular (E29.1) 3. History of Hypogonadism, testicular (E29.1) 4. History of Pituitary And Hypothalamic Disorders 5. History of Pituitary Neoplasm  Surgical History Problems  1. History of Corneal LASIK 2. History of Cystoscopy With Insertion Of Ureteral Stent Bilateral 3. History of Cystoscopy With Insertion Of Ureteral Stent Right 4. History of Cystoscopy With Ureteroscopy With Lithotripsy 5. History of Cystoscopy With Ureteroscopy With Lithotripsy 6. History of Cystoscopy With Ureteroscopy With Removal Of Calculus  Current Meds 1. Allopurinol 100 MG Oral Tablet;  Therapy: (Recorded:30Jan2015) to Recorded 2. Flurbiprofen 100 MG Oral Tablet;  Therapy: (Recorded:18Nov2014) to Recorded 3. Gabapentin 300 MG Oral Capsule;  Therapy: (Recorded:28Oct2015) to Recorded 4. Hydrochlorothiazide 12.5 MG Oral Tablet;  Therapy: (Recorded:30Jan2015) to Recorded 5. Hydrocodone-Acetaminophen 5-325 MG Oral Tablet; Take 1-2 tablets every 4-6 hours for  pain;  Therapy: 15May2014 to (Last Rx:16Sep2015)  Requested for: 16Sep2015 Ordered 6. Melatonin CAPS;  Therapy: (Recorded:06Aug2015) to Recorded 7. PriLOSEC 20 MG Oral Capsule Delayed Release;  Therapy: (Recorded:08Sep2011) to Recorded  Allergies Medication  1. Sprix SOLN  Family History Problems  1. Family history of Hematuria : Father 2. Family history of Nephrolithiasis : Father 3. Family history  of Nephrolithiasis : Mother 4. Family history of Prostate Cancer : Father 5. Family history of Stroke Syndrome : Father  Social History Problems    Alcohol Use   Caffeine  Use   Marital History - Currently Married   Never A Smoker  Vitals Vital Signs [Data Includes: Last 1 Day]  Recorded: 28Oct2015 08:15AM  Blood Pressure: 117 / 78 Temperature: 97.6 F Heart Rate: 72  Physical Exam Constitutional: Well nourished and well developed . No acute distress.  Neuro/Psych:. Mood and affect are appropriate.    Results/Data Urine [Data Includes: Last 1 Day]   28Oct2015  COLOR YELLOW   APPEARANCE CLEAR   SPECIFIC GRAVITY 1.015   pH 6.5   GLUCOSE NEG mg/dL  BILIRUBIN NEG   KETONE NEG mg/dL  BLOOD TRACE   PROTEIN NEG mg/dL  UROBILINOGEN 0.2 mg/dL  NITRITE NEG   LEUKOCYTE ESTERASE NEG   SQUAMOUS EPITHELIAL/HPF RARE   WBC 0-2 WBC/hpf  RBC 3-6 RBC/hpf  BACTERIA RARE   CRYSTALS NONE SEEN   CASTS NONE SEEN    Old records or history reviewed:Marland Kitchen.  The following images/tracing/specimen were independently visualized:  KUB and CT.    Procedure    KUB today shows bilateral renal stones 5-6 mm in size in the lower poles. Some other small stones scattered throughout the right kidney. On the left 2 stones in the left lower pole. I don't appreciate any other stones along the course of the ureters. The bones and the bowel gas pattern appear normal. There are stable pelvic phleboliths. I reviewed the KUB with his prior CT scans and KUBs.     Assessment Assessed  1. Nephrolithiasis (N20.0)  Plan Health Maintenance  1. UA With REFLEX; [Do Not Release]; Status:Complete;   Done: 28Oct2015 07:47AM Nephrolithiasis  2. BASIC METABOLIC PANEL; Status:Hold For - Specimen/Data Collection,Appointment;  Requested for:28Oct2015;  3. MAGNESIUM; Status:Hold For - Specimen/Data Collection,Appointment; Requested  for:28Oct2015;  4. PHOSPHORUS; Status:Hold For - Specimen/Data Collection,Appointment; Requested  for:28Oct2015;  5. PTH INTACT WITHOUT CALCIUM; Status:Hold For - Specimen/Data  Collection,Appointment; Requested for:28Oct2015;  6. URIC ACID; Status:Hold For -  Specimen/Data Collection,Appointment; Requested  for:28Oct2015;  7. Follow-up Schedule Surgery Office  Follow-up  Status: Hold For - Appointment   Requested for: [id number]   bilateral, staged URS, HLL, stent following a metabolic evaluation with him on current diet, allopurinol and hydrochlorothiazide.    1 Amended By: Jerilee FieldEskridge, Rocko Fesperman; Mar 08 2014 9:18 AM EST  Discussion/Summary Nephrolithiasis-first off we discussed the type of pain he is having is, and then it may not be related to the stones. I cannot guarantee any stone treatment would improve his pain. Secondly we discussed the nature risks and benefits of continued surveillance, considering bilateral ureteroscopy if his goal is to be stone free or considering stage shockwave lithotripsy of the largest stones in his goal is to reduce stone burden. We discussed specifically the risk of ureteroscopy versus shockwave but also the success rate with success rate with ureteroscopy is higher but it is more invasive and tends to have more complications than shockwave lithotripsy. However the success rate with shockwave lithotripsy this is lower but less invasive. The patient made a good point that the postop course of shockwave lithotripsy is more variable and less predictive. His goal is to be stone free therefore he would like to proceed with staged ureteroscopy. He wants to proceed with treatment as he is concerned about getting into a situation where he might have to pass a stone next year  and expense related to that. Also the LP stones have slightly increased in size compared to last year. The patient has undergone shockwave lithotripsy before as well as ureteral stents. He said he's had stents in place and also stents with strings. He said he understands stent discomfort and pain. He also said he understands the stones might become "impacted" in the ureter after shockwave lithotripsy and require other procedures. All questions answered. Will plan to  proceed with bilateral, staged URS, HLL, stent following a metabolic evaluation with him on current diet, allopurinol and hydrochlorothiazide.   I spent 45 minutes with the patient with greater than 50% of the time spent in counseling and coordination of care with the patient to discuss dietary changes to prevent stones, surgical treatment of stones, arranging metabolic evaluation of stones and surgical treatment of stones.        Signatures Electronically signed by : Jerilee Field, M.D.; Mar 08 2014  9:18AM EST Electronically signed by : Jerilee Field, M.D.; Mar 08 2014  9:18AM EST

## 2014-03-28 ENCOUNTER — Ambulatory Visit (HOSPITAL_BASED_OUTPATIENT_CLINIC_OR_DEPARTMENT_OTHER): Payer: BC Managed Care – PPO | Admitting: Anesthesiology

## 2014-03-28 ENCOUNTER — Encounter (HOSPITAL_BASED_OUTPATIENT_CLINIC_OR_DEPARTMENT_OTHER)
Admission: RE | Disposition: A | Discharge status: Home / Self Care | Payer: Self-pay | Source: Ambulatory Visit | Attending: Urology

## 2014-03-28 ENCOUNTER — Encounter (HOSPITAL_BASED_OUTPATIENT_CLINIC_OR_DEPARTMENT_OTHER): Payer: Self-pay | Admitting: *Deleted

## 2014-03-28 ENCOUNTER — Ambulatory Visit (HOSPITAL_BASED_OUTPATIENT_CLINIC_OR_DEPARTMENT_OTHER)
Admission: RE | Admit: 2014-03-28 | Discharge: 2014-03-28 | Disposition: A | Discharge status: Home / Self Care | Payer: BC Managed Care – PPO | Source: Ambulatory Visit | Attending: Urology | Admitting: Urology

## 2014-03-28 DIAGNOSIS — Z888 Allergy status to other drugs, medicaments and biological substances status: Secondary | ICD-10-CM | POA: Diagnosis not present

## 2014-03-28 DIAGNOSIS — F17210 Nicotine dependence, cigarettes, uncomplicated: Secondary | ICD-10-CM | POA: Diagnosis not present

## 2014-03-28 DIAGNOSIS — N202 Calculus of kidney with calculus of ureter: Secondary | ICD-10-CM | POA: Diagnosis present

## 2014-03-28 DIAGNOSIS — E291 Testicular hypofunction: Secondary | ICD-10-CM | POA: Diagnosis not present

## 2014-03-28 DIAGNOSIS — K219 Gastro-esophageal reflux disease without esophagitis: Secondary | ICD-10-CM | POA: Insufficient documentation

## 2014-03-28 HISTORY — DX: Calculus of kidney: N20.0

## 2014-03-28 HISTORY — PX: CYSTOSCOPY WITH URETEROSCOPY AND STENT PLACEMENT: SHX6377

## 2014-03-28 LAB — POCT HEMOGLOBIN-HEMACUE: Hemoglobin: 16.1 g/dL (ref 13.0–17.0)

## 2014-03-28 SURGERY — CYSTOURETEROSCOPY, WITH STENT INSERTION
Anesthesia: General | Site: Ureter | Laterality: Bilateral

## 2014-03-28 MED ORDER — IOHEXOL 350 MG/ML SOLN
INTRAVENOUS | Status: DC | PRN
Start: 2014-03-28 — End: 2014-03-28
  Administered 2014-03-28: 20 mL

## 2014-03-28 MED ORDER — HYDROCODONE-ACETAMINOPHEN 5-325 MG PO TABS: 1.0000 | ORAL_TABLET | Freq: Four times a day (QID) | ORAL | Status: DC | PRN

## 2014-03-28 MED ORDER — SODIUM CHLORIDE 0.9 % IR SOLN
Status: DC | PRN
  Administered 2014-03-28: 6000 mL

## 2014-03-28 MED ORDER — STERILE WATER FOR IRRIGATION IR SOLN
Status: DC | PRN
  Administered 2014-03-28: 500 mL

## 2014-03-28 MED ORDER — CEFAZOLIN SODIUM-DEXTROSE 2-3 GM-% IV SOLR
2.0000 g | INTRAVENOUS | Status: AC
  Administered 2014-03-28: 2 g via INTRAVENOUS
  Filled 2014-03-28: qty 50

## 2014-03-28 MED ORDER — URELLE 81 MG PO TABS
ORAL_TABLET | ORAL | Status: AC
  Filled 2014-03-28: qty 1

## 2014-03-28 MED ORDER — PROPOFOL 10 MG/ML IV BOLUS
INTRAVENOUS | Status: DC | PRN
  Administered 2014-03-28: 200 mg via INTRAVENOUS

## 2014-03-28 MED ORDER — FENTANYL CITRATE 0.05 MG/ML IJ SOLN
INTRAMUSCULAR | Status: DC | PRN
  Administered 2014-03-28: 50 ug via INTRAVENOUS
  Administered 2014-03-28: 25 ug via INTRAVENOUS
  Administered 2014-03-28: 50 ug via INTRAVENOUS

## 2014-03-28 MED ORDER — MEPERIDINE HCL 25 MG/ML IJ SOLN
6.2500 mg | INTRAMUSCULAR | Status: DC | PRN
  Filled 2014-03-28: qty 1

## 2014-03-28 MED ORDER — MIDAZOLAM HCL 5 MG/5ML IJ SOLN
INTRAMUSCULAR | Status: DC | PRN
  Administered 2014-03-28: 2 mg via INTRAVENOUS

## 2014-03-28 MED ORDER — MIDAZOLAM HCL 2 MG/2ML IJ SOLN
INTRAMUSCULAR | Status: AC
  Filled 2014-03-28: qty 2

## 2014-03-28 MED ORDER — DEXAMETHASONE SODIUM PHOSPHATE 4 MG/ML IJ SOLN
INTRAMUSCULAR | Status: DC | PRN
  Administered 2014-03-28: 10 mg via INTRAVENOUS

## 2014-03-28 MED ORDER — DOCUSATE SODIUM 100 MG PO CAPS: 100.0000 mg | ORAL_CAPSULE | Freq: Two times a day (BID) | ORAL | Status: DC | PRN

## 2014-03-28 MED ORDER — ONDANSETRON HCL 4 MG/2ML IJ SOLN
INTRAMUSCULAR | Status: DC | PRN
  Administered 2014-03-28: 4 mg via INTRAVENOUS

## 2014-03-28 MED ORDER — KETOROLAC TROMETHAMINE 30 MG/ML IJ SOLN
INTRAMUSCULAR | Status: DC | PRN
  Administered 2014-03-28: 30 mg via INTRAVENOUS

## 2014-03-28 MED ORDER — LACTATED RINGERS IV SOLN
INTRAVENOUS | Status: DC
  Administered 2014-03-28 (×2): via INTRAVENOUS
  Filled 2014-03-28: qty 1000

## 2014-03-28 MED ORDER — OXYCODONE HCL 5 MG PO TABS
ORAL_TABLET | ORAL | Status: AC
  Filled 2014-03-28: qty 1

## 2014-03-28 MED ORDER — ACETAMINOPHEN 10 MG/ML IV SOLN
INTRAVENOUS | Status: DC | PRN
  Administered 2014-03-28: 1000 mg via INTRAVENOUS

## 2014-03-28 MED ORDER — LIDOCAINE HCL (CARDIAC) 20 MG/ML IV SOLN
INTRAVENOUS | Status: DC | PRN
  Administered 2014-03-28: 100 mg via INTRAVENOUS

## 2014-03-28 MED ORDER — URELLE 81 MG PO TABS
1.0000 | ORAL_TABLET | Freq: Four times a day (QID) | ORAL | Status: DC
  Administered 2014-03-28: 81 mg via ORAL
  Filled 2014-03-28: qty 1

## 2014-03-28 MED ORDER — OXYCODONE HCL 5 MG/5ML PO SOLN
5.0000 mg | Freq: Once | ORAL | Status: AC | PRN
  Filled 2014-03-28: qty 5

## 2014-03-28 MED ORDER — FENTANYL CITRATE 0.05 MG/ML IJ SOLN
INTRAMUSCULAR | Status: AC
  Filled 2014-03-28: qty 4

## 2014-03-28 MED ORDER — CEFAZOLIN SODIUM-DEXTROSE 2-3 GM-% IV SOLR
INTRAVENOUS | Status: AC
  Filled 2014-03-28: qty 50

## 2014-03-28 MED ORDER — LIDOCAINE HCL 2 % EX GEL
CUTANEOUS | Status: DC | PRN
  Administered 2014-03-28: 1 via URETHRAL

## 2014-03-28 MED ORDER — PROMETHAZINE HCL 25 MG/ML IJ SOLN
6.2500 mg | INTRAMUSCULAR | Status: DC | PRN
  Filled 2014-03-28: qty 1

## 2014-03-28 MED ORDER — CEFAZOLIN SODIUM 1-5 GM-% IV SOLN
1.0000 g | INTRAVENOUS | Status: DC
  Filled 2014-03-28: qty 50

## 2014-03-28 MED ORDER — HYDROMORPHONE HCL 1 MG/ML IJ SOLN
0.2500 mg | INTRAMUSCULAR | Status: DC | PRN
  Filled 2014-03-28: qty 1

## 2014-03-28 MED ORDER — OXYCODONE HCL 5 MG PO TABS
5.0000 mg | ORAL_TABLET | Freq: Once | ORAL | Status: AC | PRN
  Administered 2014-03-28: 5 mg via ORAL
  Filled 2014-03-28: qty 1

## 2014-03-28 SURGICAL SUPPLY — 35 items
ADAPTER CATH URET PLST 4-6FR (CATHETERS) IMPLANT
BAG DRAIN URO-CYSTO SKYTR STRL (DRAIN) ×2 IMPLANT
BASKET LASER NITINOL 1.9FR (BASKET) IMPLANT
BASKET STNLS GEMINI 4WIRE 3FR (BASKET) IMPLANT
BASKET STONE 1.7 NGAGE (UROLOGICAL SUPPLIES) ×2 IMPLANT
BASKET ZERO TIP NITINOL 2.4FR (BASKET) IMPLANT
CANISTER SUCT LVC 12 LTR MEDI- (MISCELLANEOUS) ×2 IMPLANT
CATH INTERMIT  6FR 70CM (CATHETERS) IMPLANT
CATH URET 5FR 28IN CONE TIP (BALLOONS)
CATH URET 5FR 28IN OPEN ENDED (CATHETERS) IMPLANT
CATH URET 5FR 70CM CONE TIP (BALLOONS) IMPLANT
CLOTH BEACON ORANGE TIMEOUT ST (SAFETY) ×2 IMPLANT
DRAPE CAMERA CLOSED 9X96 (DRAPES) IMPLANT
ELECT REM PT RETURN 9FT ADLT (ELECTROSURGICAL)
ELECTRODE REM PT RTRN 9FT ADLT (ELECTROSURGICAL) IMPLANT
FIBER LASER TRAC TIP (UROLOGICAL SUPPLIES) ×2 IMPLANT
GLOVE BIO SURGEON STRL SZ7.5 (GLOVE) ×2 IMPLANT
GLOVE INDICATOR 7.5 STRL GRN (GLOVE) ×4 IMPLANT
GLOVE SURG SS PI 7.5 STRL IVOR (GLOVE) ×4 IMPLANT
GOWN PREVENTION PLUS LG XLONG (DISPOSABLE) IMPLANT
GOWN STRL REIN XL XLG (GOWN DISPOSABLE) IMPLANT
GOWN STRL REUS W/TWL XL LVL3 (GOWN DISPOSABLE) ×2 IMPLANT
GUIDEWIRE 0.038 PTFE COATED (WIRE) IMPLANT
GUIDEWIRE ANG ZIPWIRE 038X150 (WIRE) IMPLANT
GUIDEWIRE STR DUAL SENSOR (WIRE) ×4 IMPLANT
IV NS IRRIG 3000ML ARTHROMATIC (IV SOLUTION) ×4 IMPLANT
KIT BALLIN UROMAX 15FX10 (LABEL) IMPLANT
KIT BALLN UROMAX 15FX4 (MISCELLANEOUS) IMPLANT
KIT BALLN UROMAX 26 75X4 (MISCELLANEOUS)
PACK CYSTO (CUSTOM PROCEDURE TRAY) ×2 IMPLANT
SET HIGH PRES BAL DIL (LABEL)
SHEATH ACCESS URETERAL 38CM (SHEATH) IMPLANT
SHEATH ACCESS URETERAL 54CM (SHEATH) IMPLANT
STENT URET 6FRX26 CONTOUR (STENTS) ×4 IMPLANT
SYRINGE IRR TOOMEY STRL 70CC (SYRINGE) IMPLANT

## 2014-03-28 NOTE — Transfer of Care (Signed)
Immediate Anesthesia Transfer of Care Note  Patient: Raymond Chandler Study  Procedure(s) Performed: Procedure(s): BILATERAL URETEROSCOPY/LASER LITHOTRIPSY ON RIGHT;  AND BIL STENT PLACEMENT (Bilateral)  Patient Location: PACU  Anesthesia Type:General  Level of Consciousness: awake and oriented  Airway & Oxygen Therapy: Patient Spontanous Breathing and Patient connected to nasal cannula oxygen  Post-op Assessment: Report given to PACU RN  Post vital signs: Reviewed and stable  Complications: No apparent anesthesia complications

## 2014-03-28 NOTE — Interval H&P Note (Signed)
History and Physical Interval Note:  03/28/2014 10:13 AM  Raymond LewAllen Chandler  has presented today for surgery, with the diagnosis of bilateral renal stones  The various methods of treatment have been discussed with the patient and family. After consideration of risks, benefits and other options for treatment, the patient has consented to  Procedure(s): BILATERAL URETEROSCOPY/LASER LITHOTRIPSY AND STENT PLACEMENT (Bilateral) as a surgical intervention .  The patient's history has been reviewed, patient examined, no change in status, stable for surgery.  I have reviewed the patient's chart and labs.  Questions were answered to the patient's satisfaction.  Patient as well today.  He has no dysuria or fever.  We discussed the has multiple stones in both kidneys and will very likely need a staged procedure.  All questions answered.  He elected to proceed.   Jerilee FieldEskridge, Binyomin Brann

## 2014-03-28 NOTE — Anesthesia Postprocedure Evaluation (Signed)
Anesthesia Post Note  Patient: Raymond Chandler  Procedure(s) Performed: Procedure(s) (LRB): BILATERAL URETEROSCOPY/LASER LITHOTRIPSY ON RIGHT;  AND BIL STENT PLACEMENT (Bilateral)  Anesthesia type: General  Patient location: PACU  Post pain: Pain level controlled  Post assessment: Post-op Vital signs reviewed  Last Vitals: BP 125/76 mmHg  Pulse 73  Temp(Src) 36.7 C (Oral)  Resp 14  Ht 5\' 9"  (1.753 m)  Wt 195 lb (88.451 kg)  BMI 28.78 kg/m2  SpO2 100%  Post vital signs: Reviewed  Level of consciousness: sedated  Complications: No apparent anesthesia complications

## 2014-03-28 NOTE — Op Note (Signed)
Preoperative diagnosis: Bilateral nephrolithiasis Postoperative diagnosis: Right ureteral stone, bilateral nephrolithiasis  Procedure: Cystoscopy, Bilateral retrograde pyelogram, Right ureteroscopy with holmium laser lithotripsy and stone basket extraction,  bilateral ureteral stent placement  Surgeon: Lennard Capek  Type of anesthesia: Gen.   Indication for procedure: Patient's a 33 year old with active stone disease. His passed several stone and had some flank pain. He desired to be stone free going into 2016 and because he had multiple stones and had passed stones prior in the year it was felt to be a reasonable request. In fact today he was found to be passing a stone in the right ureter which was extracted.  Findings: Right retrograde pyelogram-this outlined a single ureter single collecting system unit. There was a small filling defect in the right distal ureter which was found to be a stone. Otherwise the ureter appeared normal without dilation or stricture or other filling defect. The collecting system appeared normal. There was no dilation or filling defect of the collecting system.  Left retrograde pyelogram-this outlined a single ureter single collecting system unit without filling defect, stricture or dilation.  On cystoscopy the urethra was normal. The bladder contained no stone or foreign body. The bladder mucosa appeared normal. The trigone ureteral orifices were normal.  On right ureteroscopy there was a stone noted in the distal ureter which required fragmentation to remove.    description of procedure: After consent was obtained patient brought to the operating room. After adequate anesthesia his placed in lithotomy position and prepped and draped in the usual sterile fashion. A timeout was performed to confirm the patient and procedure. The tip of the dual-lumen exchange catheter was used to cannulate the right ureteral orifice and retrograde injection of contrast was  performed. A sensor wire was then advanced and cold in the collecting system. Adjacent to the sensor wire a semirigid ureteroscope was advanced and the stone was entrapped within the engage basket. The stone was too large to come through the intramural ureter and therefore a 200  laser fiber was deployed in the stone was fragmented into tiny pieces. A second wire was advanced under direct vision and the scope removed.  The dual-lumen when easily but the access sheath would not go. Also the inner lumen of the access sheath would not pass. Therefore one of the wires was removed and the other backloaded on the cystoscope. A 6 x 26 cm stent was advanced. The wire was removed and a good coil set up in the right renal pelvis and a good coil in the bladder.  The left side was approached in the left ureteral orifice cannulated with the tip of the dual-lumen and left retrograde was performed. A sensor wire was then advanced and coiled in the collecting system. The dual-lumen was backed out and and the scope removed. The dual-lumen was replaced and went easily and a second wire was placed. Again the inner cannula and or the access sheath would not advance. Seeing that the patient had multiple stones in both kidneys and was going to need a staged procedure a place a stent on the left also. The wire was backloaded on the cystoscope and a 626 cm stent was advanced. The wire was removed and a good coil reconstituted in the left collecting system and a good coil in the bladder. The bladder was drained and the scope removed. Lidocaine jelly was instilled per urethra and a light penile implant placed. Patient was awakened and taken to the recovery room in stable condition.  Complications: None Blood loss: Minimal Specimens: None  Drains: Bilateral 6 x 26 cm ureteral stents  Disposition: Patient stable to PACU-he'll be scheduled for bilateral ureteroscopy in the coming weeks.

## 2014-03-28 NOTE — Discharge Instructions (Signed)
Ureteral Stent Implantation, Care After Refer to this sheet in the next few weeks. These instructions provide you with information on caring for yourself after your procedure. Your health care provider may also give you more specific instructions. Your treatment has been planned according to current medical practices, but problems sometimes occur. Call your health care provider if you have any problems or questions after your procedure. WHAT TO EXPECT AFTER THE PROCEDURE You should be back to normal activity within 48 hours after the procedure. Nausea and vomiting may occur and are commonly the result of anesthesia. It is common to experience sharp pain in the back or lower abdomen and penis with voiding. This is caused by movement of the ends of the stent with the act of urinating.It usually goes away within minutes after you have stopped urinating.  HOME CARE INSTRUCTIONS Make sure to drink plenty of fluids. You may have small amounts of bleeding, causing your urine to be red. This is normal. Certain movements may trigger pain or a feeling that you need to urinate. You may be given medicines to prevent infection or bladder spasms. Be sure to take all medicines as directed. Only take over-the-counter or prescription medicines for pain, discomfort, or fever as directed by your health care provider. Do not take aspirin, as this can make bleeding worse.  Your stents will be left in until the kidney stones are removed. This may take 2 weeks or longer.  SEEK MEDICAL CARE IF:  You experience increasing pain.  Your pain medicine is not working. SEEK IMMEDIATE MEDICAL CARE IF:  Your urine is dark red or has blood clots.  You are leaking urine (incontinent).  You have a fever, chills, feeling sick to your stomach (nausea), or vomiting.  Your pain is not relieved by pain medicine.  The end of the stent comes out of the urethra.  You are unable to urinate. Document Released: 12/29/2012 Document  Revised: 05/03/2013 Document Reviewed: 12/29/2012 [facility] Patient Information 2015 [employer] [place]. This information is not intended to replace advice given to you by your health care provider. Make sure you discuss any questions you have with your health care provider.   Post Anesthesia Home Care Instructions  Activity: Get plenty of rest for the remainder of the day. A responsible adult should stay with you for 24 hours following the procedure.  For the next 24 hours, DO NOT: -Drive a car -Advertising copywriterperate machinery -Drink alcoholic beverages -Take any medication unless instructed by your physician -Make any legal decisions or sign important papers.  Meals: Start with liquid foods such as gelatin or soup. Progress to regular foods as tolerated. Avoid greasy, spicy, heavy foods. If nausea and/or vomiting occur, drink only clear liquids until the nausea and/or vomiting subsides. Call your physician if vomiting continues.  Special Instructions/Symptoms: Your throat may feel dry or sore from the anesthesia or the breathing tube placed in your throat during surgery. If this causes discomfort, gargle with warm salt water. The discomfort should disappear within 24 hours.

## 2014-03-28 NOTE — Anesthesia Preprocedure Evaluation (Signed)
Anesthesia Evaluation  Patient identified by MRN, date of birth, ID band Patient awake    Reviewed: Allergy & Precautions, H&P , NPO status , Patient's Chart, lab work & pertinent test results  Airway Mallampati: II  TM Distance: >3 FB Neck ROM: Full    Dental no notable dental hx. (+) Teeth Intact   Pulmonary Current Smoker,  breath sounds clear to auscultation  Pulmonary exam normal       Cardiovascular negative cardio ROS  Rhythm:Regular Rate:Normal     Neuro/Psych  Headaches, negative psych ROS   GI/Hepatic Neg liver ROS, GERD-  Medicated and Controlled,  Endo/Other  negative endocrine ROS  Renal/GU Renal disease     Musculoskeletal negative musculoskeletal ROS (+)   Abdominal   Peds  Hematology negative hematology ROS (+)   Anesthesia Other Findings   Reproductive/Obstetrics                             Anesthesia Physical  Anesthesia Plan  ASA: II  Anesthesia Plan: General   Post-op Pain Management:    Induction: Intravenous  Airway Management Planned: LMA  Additional Equipment:   Intra-op Plan:   Post-operative Plan: Extubation in OR  Informed Consent: I have reviewed the patients History and Physical, chart, labs and discussed the procedure including the risks, benefits and alternatives for the proposed anesthesia with the patient or authorized representative who has indicated his/her understanding and acceptance.   Dental advisory given  Plan Discussed with: CRNA  Anesthesia Plan Comments:         Anesthesia Quick Evaluation

## 2014-03-28 NOTE — Anesthesia Procedure Notes (Signed)
Procedure Name: LMA Insertion Date/Time: 03/28/2014 10:12 AM Performed by: Maris BergerENENNY, Raymond Chandler Pre-anesthesia Checklist: Patient identified, Emergency Drugs available, Suction available and Patient being monitored Patient Re-evaluated:Patient Re-evaluated prior to inductionOxygen Delivery Method: Circle System Utilized Preoxygenation: Pre-oxygenation with 100% oxygen Intubation Type: IV induction Ventilation: Mask ventilation without difficulty LMA: LMA inserted LMA Size: 4.0 Number of attempts: 1 Airway Equipment and Method: bite block Placement Confirmation: positive ETCO2 Dental Injury: Teeth and Oropharynx as per pre-operative assessment

## 2014-03-29 ENCOUNTER — Other Ambulatory Visit: Payer: Self-pay | Admitting: Urology

## 2014-03-30 ENCOUNTER — Encounter (HOSPITAL_BASED_OUTPATIENT_CLINIC_OR_DEPARTMENT_OTHER): Payer: Self-pay | Admitting: Urology

## 2014-04-10 ENCOUNTER — Encounter (HOSPITAL_COMMUNITY)
Admission: RE | Admit: 2014-04-10 | Discharge: 2014-04-10 | Disposition: A | Discharge status: Home / Self Care | Payer: BC Managed Care – PPO | Source: Ambulatory Visit | Attending: Urology | Admitting: Urology

## 2014-04-10 ENCOUNTER — Encounter (HOSPITAL_COMMUNITY): Payer: Self-pay

## 2014-04-10 DIAGNOSIS — N200 Calculus of kidney: Secondary | ICD-10-CM | POA: Diagnosis present

## 2014-04-10 DIAGNOSIS — F17210 Nicotine dependence, cigarettes, uncomplicated: Secondary | ICD-10-CM | POA: Diagnosis not present

## 2014-04-10 DIAGNOSIS — K219 Gastro-esophageal reflux disease without esophagitis: Secondary | ICD-10-CM | POA: Diagnosis not present

## 2014-04-10 HISTORY — DX: Hyperuricemia without signs of inflammatory arthritis and tophaceous disease: E79.0

## 2014-04-10 HISTORY — DX: Other subjective visual disturbances: H53.19

## 2014-04-10 HISTORY — DX: Headache: R51

## 2014-04-10 HISTORY — DX: Personal history of other diseases of the respiratory system: Z87.09

## 2014-04-10 HISTORY — DX: Headache, unspecified: R51.9

## 2014-04-10 LAB — CBC
HEMATOCRIT: 44 % (ref 39.0–52.0)
Hemoglobin: 15.1 g/dL (ref 13.0–17.0)
MCH: 28.5 pg (ref 26.0–34.0)
MCHC: 34.3 g/dL (ref 30.0–36.0)
MCV: 83 fL (ref 78.0–100.0)
Platelets: 315 10*3/uL (ref 150–400)
RBC: 5.3 MIL/uL (ref 4.22–5.81)
RDW: 13.7 % (ref 11.5–15.5)
WBC: 6.9 10*3/uL (ref 4.0–10.5)

## 2014-04-10 LAB — BASIC METABOLIC PANEL
ANION GAP: 11 (ref 5–15)
BUN: 14 mg/dL (ref 6–23)
CO2: 28 mEq/L (ref 19–32)
Calcium: 9.9 mg/dL (ref 8.4–10.5)
Chloride: 100 mEq/L (ref 96–112)
Creatinine, Ser: 1.06 mg/dL (ref 0.50–1.35)
Glucose, Bld: 95 mg/dL (ref 70–99)
Potassium: 3.6 mEq/L — ABNORMAL LOW (ref 3.7–5.3)
Sodium: 139 mEq/L (ref 137–147)

## 2014-04-10 NOTE — Patient Instructions (Addendum)
[name]  04/10/2014   Your procedure is scheduled on: Thursday 04/13/14  [facility]30 AM.  Call this number if you have problems the morning of surgery [phone]: [phone]   Remember:   Do not eat food or drink liquids After Midnight.     Take these medicines the morning of surgery with A SIP OF WATER: nexium   Do not wear jewelry, make-up or nail polish.  Do not wear lotions, powders, or perfumes.   Do not shave 48 hours prior to surgery. Men may shave face and neck.  Do not bring valuables to the hospital.  Contacts, dentures or bridgework may not be worn into surgery.   Patients discharged the day of surgery will not be allowed to drive home.  Name and phone number of your driver: Madelaine Bhatdam [phone]    [facility] - Pre[facility] Before surgery, you can play an important role.  Because skin is not sterile, your skin needs to be as free of germs as possible.  You can reduce the number of germs on your skin by washing with CHG (chlorahexidine gluconate) soap before surgery.  CHG is an antiseptic cleaner which kills germs and bonds with the skin to continue killing germs even after washing. Please DO NOT use if you have an allergy to CHG or antibacterial soaps.  If your skin becomes reddened/irritated stop using the CHG and inform your nurse when you arrive at Short Stay. Do not shave (including legs and underarms) for at least 48 hours prior to the first CHG shower.  You may shave your face/neck. Please follow these instructions carefully:  1.  Shower with CHG Soap the night before surgery and the  morning of Surgery.  2.  If you choose to wash your hair, wash your hair first as usual with your  normal  shampoo.  3.  After you shampoo, rinse your hair and body thoroughly to remove the  shampoo.                            4.  Use CHG as you would any other liquid soap.  You can apply chg directly  to the skin and wash   Gently with a scrungie or clean washcloth.  5.  Apply the CHG Soap to your body ONLY FROM THE NECK DOWN.   Do not use on face/ open                           Wound or open sores. Avoid contact with eyes, ears mouth and genitals (private parts).                       Wash face,  Genitals (private parts) with your normal soap.             6.  Wash thoroughly, paying special attention to the area where your surgery  will be performed.  7.  Thoroughly rinse your body with warm water from the neck down.  8.  DO NOT shower/wash with your normal soap after using and rinsing off  the CHG Soap.                9.  Pat yourself dry with a clean towel.            10.  Wear clean pajamas.  11.  Place clean sheets on your bed the night of your first shower and do not  sleep with pets. Day of Surgery : Do not apply any lotions/deodorants the morning of surgery.  Please wear clean clothes to the hospital/surgery center.  FAILURE TO FOLLOW THESE INSTRUCTIONS MAY RESULT IN THE CANCELLATION OF YOUR SURGERY PATIENT SIGNATURE_________________________________  NURSE SIGNATURE__________________________________  ________________________________________________________________________

## 2014-04-12 NOTE — Anesthesia Preprocedure Evaluation (Addendum)
Anesthesia Evaluation  Patient identified by MRN, date of birth, ID band Patient awake    Reviewed: Allergy & Precautions, H&P , NPO status , Patient's Chart, lab work & pertinent test results  History of Anesthesia Complications Negative for: history of anesthetic complications  Airway Mallampati: II  TM Distance: >3 FB Neck ROM: Full    Dental no notable dental hx. (+) Dental Advisory Given   Pulmonary Current Smoker,  breath sounds clear to auscultation  Pulmonary exam normal       Cardiovascular Exercise Tolerance: Good negative cardio ROS  Rhythm:Regular Rate:Normal     Neuro/Psych  Headaches, negative psych ROS   GI/Hepatic Neg liver ROS, GERD-  Medicated and Controlled,  Endo/Other  negative endocrine ROS  Renal/GU Renal disease  negative genitourinary   Musculoskeletal negative musculoskeletal ROS (+)   Abdominal   Peds negative pediatric ROS (+)  Hematology negative hematology ROS (+)   Anesthesia Other Findings   Reproductive/Obstetrics negative OB ROS                            Anesthesia Physical Anesthesia Plan  ASA: II  Anesthesia Plan: General   Post-op Pain Management:    Induction: Intravenous  Airway Management Planned: LMA  Additional Equipment:   Intra-op Plan:   Post-operative Plan: Extubation in OR  Informed Consent: I have reviewed the patients History and Physical, chart, labs and discussed the procedure including the risks, benefits and alternatives for the proposed anesthesia with the patient or authorized representative who has indicated his/her understanding and acceptance.   Dental advisory given  Plan Discussed with: CRNA  Anesthesia Plan Comments:         Anesthesia Quick Evaluation

## 2014-04-13 ENCOUNTER — Ambulatory Visit (HOSPITAL_COMMUNITY): Payer: BC Managed Care – PPO | Admitting: Anesthesiology

## 2014-04-13 ENCOUNTER — Ambulatory Visit (HOSPITAL_COMMUNITY)
Admission: RE | Admit: 2014-04-13 | Discharge: 2014-04-13 | Disposition: A | Discharge status: Home / Self Care | Payer: BC Managed Care – PPO | Source: Ambulatory Visit | Attending: Urology | Admitting: Urology

## 2014-04-13 ENCOUNTER — Encounter (HOSPITAL_COMMUNITY)
Admission: RE | Disposition: A | Discharge status: Home / Self Care | Payer: Self-pay | Source: Ambulatory Visit | Attending: Urology

## 2014-04-13 ENCOUNTER — Encounter (HOSPITAL_COMMUNITY): Payer: Self-pay

## 2014-04-13 DIAGNOSIS — N200 Calculus of kidney: Secondary | ICD-10-CM | POA: Insufficient documentation

## 2014-04-13 DIAGNOSIS — F17210 Nicotine dependence, cigarettes, uncomplicated: Secondary | ICD-10-CM | POA: Insufficient documentation

## 2014-04-13 DIAGNOSIS — K219 Gastro-esophageal reflux disease without esophagitis: Secondary | ICD-10-CM | POA: Insufficient documentation

## 2014-04-13 SURGERY — CYSTOSCOPY/URETEROSCOPY/HOLMIUM LASER/STENT PLACEMENT
Anesthesia: General | Laterality: Bilateral

## 2014-04-13 MED ORDER — ACETAMINOPHEN 10 MG/ML IV SOLN
1000.0000 mg | Freq: Once | INTRAVENOUS | Status: AC
  Administered 2014-04-13: 1000 mg via INTRAVENOUS
  Filled 2014-04-13: qty 100

## 2014-04-13 MED ORDER — PROPOFOL 10 MG/ML IV BOLUS
INTRAVENOUS | Status: AC
  Filled 2014-04-13: qty 20

## 2014-04-13 MED ORDER — CEFAZOLIN SODIUM-DEXTROSE 2-3 GM-% IV SOLR
INTRAVENOUS | Status: AC
  Filled 2014-04-13: qty 50

## 2014-04-13 MED ORDER — IOHEXOL 350 MG/ML SOLN
INTRAVENOUS | Status: DC | PRN
  Administered 2014-04-13: 50 mL via INTRAVENOUS

## 2014-04-13 MED ORDER — SODIUM CHLORIDE 0.9 % IR SOLN
Status: DC | PRN
  Administered 2014-04-13: 3000 mL

## 2014-04-13 MED ORDER — CEFAZOLIN SODIUM-DEXTROSE 2-3 GM-% IV SOLR
2.0000 g | INTRAVENOUS | Status: AC
  Administered 2014-04-13: 2 g via INTRAVENOUS

## 2014-04-13 MED ORDER — FENTANYL CITRATE 0.05 MG/ML IJ SOLN
INTRAMUSCULAR | Status: DC | PRN
  Administered 2014-04-13 (×4): 25 ug via INTRAVENOUS
  Administered 2014-04-13: 50 ug via INTRAVENOUS
  Administered 2014-04-13 (×4): 25 ug via INTRAVENOUS
  Administered 2014-04-13: 50 ug via INTRAVENOUS
  Administered 2014-04-13 (×2): 25 ug via INTRAVENOUS

## 2014-04-13 MED ORDER — HYDROCODONE-ACETAMINOPHEN 5-325 MG PO TABS: 1.0000 | ORAL_TABLET | Freq: Four times a day (QID) | ORAL | Status: DC | PRN

## 2014-04-13 MED ORDER — FENTANYL CITRATE 0.05 MG/ML IJ SOLN
INTRAMUSCULAR | Status: DC
Start: 2014-04-13 — End: 2014-04-13
  Filled 2014-04-13: qty 2

## 2014-04-13 MED ORDER — FENTANYL CITRATE 0.05 MG/ML IJ SOLN
INTRAMUSCULAR | Status: AC
  Filled 2014-04-13: qty 2

## 2014-04-13 MED ORDER — DEXAMETHASONE SODIUM PHOSPHATE 10 MG/ML IJ SOLN
INTRAMUSCULAR | Status: DC | PRN
  Administered 2014-04-13: 10 mg via INTRAVENOUS

## 2014-04-13 MED ORDER — KETOROLAC TROMETHAMINE 30 MG/ML IJ SOLN
INTRAMUSCULAR | Status: AC
  Filled 2014-04-13: qty 1

## 2014-04-13 MED ORDER — LIDOCAINE HCL 2 % EX GEL
CUTANEOUS | Status: AC
  Filled 2014-04-13: qty 10

## 2014-04-13 MED ORDER — LIDOCAINE HCL 2 % EX GEL
CUTANEOUS | Status: DC | PRN
  Administered 2014-04-13: 1 via URETHRAL

## 2014-04-13 MED ORDER — FENTANYL CITRATE 0.05 MG/ML IJ SOLN
INTRAMUSCULAR | Status: AC
  Filled 2014-04-13: qty 5

## 2014-04-13 MED ORDER — MIDAZOLAM HCL 2 MG/2ML IJ SOLN
INTRAMUSCULAR | Status: AC
  Filled 2014-04-13: qty 2

## 2014-04-13 MED ORDER — URIBEL 118 MG PO CAPS: 1.0000 | ORAL_CAPSULE | Freq: Three times a day (TID) | ORAL | Status: DC

## 2014-04-13 MED ORDER — NITROFURANTOIN MONOHYD MACRO 100 MG PO CAPS: 100.0000 mg | ORAL_CAPSULE | Freq: Every day | ORAL | Status: DC

## 2014-04-13 MED ORDER — SODIUM CHLORIDE 0.9 % IR SOLN
Status: DC | PRN
  Administered 2014-04-13: 4000 mL

## 2014-04-13 MED ORDER — PROPOFOL 10 MG/ML IV BOLUS
INTRAVENOUS | Status: DC | PRN
  Administered 2014-04-13: 150 mg via INTRAVENOUS
  Administered 2014-04-13: 50 mg via INTRAVENOUS

## 2014-04-13 MED ORDER — MIDAZOLAM HCL 5 MG/5ML IJ SOLN
INTRAMUSCULAR | Status: DC | PRN
  Administered 2014-04-13: 2 mg via INTRAVENOUS

## 2014-04-13 MED ORDER — KETOROLAC TROMETHAMINE 30 MG/ML IJ SOLN
INTRAMUSCULAR | Status: DC | PRN
  Administered 2014-04-13: 30 mg via INTRAVENOUS

## 2014-04-13 MED ORDER — FENTANYL CITRATE 0.05 MG/ML IJ SOLN
25.0000 ug | INTRAMUSCULAR | Status: DC | PRN
  Administered 2014-04-13: 50 ug via INTRAVENOUS

## 2014-04-13 MED ORDER — 0.9 % SODIUM CHLORIDE (POUR BTL) OPTIME
TOPICAL | Status: DC | PRN
  Administered 2014-04-13: 1000 mL

## 2014-04-13 MED ORDER — DEXAMETHASONE SODIUM PHOSPHATE 10 MG/ML IJ SOLN
INTRAMUSCULAR | Status: AC
  Filled 2014-04-13: qty 1

## 2014-04-13 MED ORDER — ONDANSETRON HCL 4 MG/2ML IJ SOLN: 4.0000 mg | Freq: Once | INTRAMUSCULAR | Status: DC | PRN

## 2014-04-13 MED ORDER — ONDANSETRON HCL 4 MG/2ML IJ SOLN
INTRAMUSCULAR | Status: AC
  Filled 2014-04-13: qty 2

## 2014-04-13 MED ORDER — LACTATED RINGERS IV SOLN
INTRAVENOUS | Status: DC | PRN
  Administered 2014-04-13 (×2): via INTRAVENOUS

## 2014-04-13 MED ORDER — ONDANSETRON HCL 4 MG/2ML IJ SOLN
INTRAMUSCULAR | Status: DC | PRN
  Administered 2014-04-13: 4 mg via INTRAVENOUS

## 2014-04-13 SURGICAL SUPPLY — 26 items
BAG URO CATCHER STRL LF (DRAPE) ×2 IMPLANT
BASKET LASER NITINOL 1.9FR (BASKET) IMPLANT
BASKET STNLS GEMINI 4WIRE 3FR (BASKET) IMPLANT
BASKET ZERO TIP NITINOL 2.4FR (BASKET) IMPLANT
BRUSH URET BIOPSY 3F (UROLOGICAL SUPPLIES) IMPLANT
CATH INTERMIT  6FR 70CM (CATHETERS) IMPLANT
CATH URET DUAL LUMEN 6-10FR 50 (CATHETERS) ×2 IMPLANT
CLOTH BEACON ORANGE TIMEOUT ST (SAFETY) ×2 IMPLANT
DRAPE CAMERA CLOSED 9X96 (DRAPES) IMPLANT
EXTRACTOR STONE NITINOL NGAGE (UROLOGICAL SUPPLIES) ×4 IMPLANT
FIBER LASER FLEXIVA 200 (UROLOGICAL SUPPLIES) ×2 IMPLANT
FIBER LASER TRAC TIP (UROLOGICAL SUPPLIES) ×2 IMPLANT
GLOVE BIOGEL M STRL SZ7.5 (GLOVE) ×2 IMPLANT
GOWN STRL REUS W/TWL XL LVL3 (GOWN DISPOSABLE) ×2 IMPLANT
GUIDEWIRE ANG ZIPWIRE 038X150 (WIRE) ×2 IMPLANT
GUIDEWIRE STR DUAL SENSOR (WIRE) ×4 IMPLANT
IV NS IRRIG 3000ML ARTHROMATIC (IV SOLUTION) ×4 IMPLANT
KIT BALLIN UROMAX 15FX10 (LABEL) IMPLANT
KIT BALLN UROMAX 15FX4 (MISCELLANEOUS) IMPLANT
KIT BALLN UROMAX 26 75X4 (MISCELLANEOUS)
PACK CYSTO (CUSTOM PROCEDURE TRAY) ×2 IMPLANT
SET HIGH PRES BAL DIL (LABEL)
SHEATH ACCESS URETERAL 24CM (SHEATH) IMPLANT
SHEATH ACCESS URETERAL 54CM (SHEATH) ×2 IMPLANT
STENT SFT UR W/WIRE 6FRX26CM (STENTS) ×4 IMPLANT
SYRINGE IRR TOOMEY STRL 70CC (SYRINGE) IMPLANT

## 2014-04-13 NOTE — H&P (Signed)
H&P   History of Present Illness: Pt presents for bilateral ureteroscopy, HLL, stone basket and stent exchange. He has no complaints. Stent discomfort resolved. No fever or dysuria.   Past Medical History  Diagnosis Date  . Hypogonadism male   . 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 GERD (gastroesophageal reflux disease)   . History of kidney stones     since age 33  . Urgency of urination   . Renal calculus, bilateral   . Headache     headaches- sometimes turns into migraines (about 6 in 2015)  . H/O bronchitis   . Visual distortion     both eyes- wavey appearance  . High level of uric acid in blood     no GOUT-prevention medication   Past Surgical History  Procedure Laterality Date  . Extracorporeal shock wave lithotripsy  age 33  . Hypospadias correction  age 33  . Cystoscopy/retrograde/ureteroscopy/stone extraction with basket Bilateral 04/29/2013    Procedure: CYSTOSCOPY/BILATERAL RETROGRADE LEFT URETEROSCOPY/STONE REMOVAL WITH HOLMIUM LASER AND DIGITAL URETEROSCOPE with BILATERAL STENT PLACEMENT   ;  Surgeon: Kathi LudwigSigmund I Tannenbaum, MD;  [facility] [place];  Service: Urology;  Laterality: Bilateral;  . Cystoscopy with retrograde pyelogram, ureteroscopy and stent placement Right 06/03/2013    Procedure: CYSTOSCOPY WITH RETROGRADE PYELOGRAM, URETEROSCOPY , BACKSTOP APPLICATION, STONE RETRIEVED;  Surgeon: Kathi LudwigSigmund I Tannenbaum, MD;  [facility] [place];  Service: Urology;  Laterality: Right;  STONE IN SPECIMEN JAR AT 1000 DID TIME OUT  . Holmium laser application Right 06/03/2013    Procedure: HOLMIUM LASER APPLICATION;  Surgeon: Kathi LudwigSigmund I Tannenbaum, MD;  [facility] [place];  Service: Urology;  Laterality: Right;  . Cystoscopy w/ ureteral stent removal Right 06/03/2013    Procedure: CYSTOSCOPY WITH STENT REMOVAL;  Surgeon: Kathi LudwigSigmund I Tannenbaum, MD;  [facility];  Service: Urology;  Laterality: Right;  . Cystoscopy with stent placement  06/03/2013    Procedure: CYSTOSCOPY WITH STENT PLACEMENT;  Surgeon: Kathi LudwigSigmund I Tannenbaum, MD;  [facility];  Service: Urology;;  . Cystoscopy with ureteroscopy and stent placement Bilateral 03/28/2014    Procedure: BILATERAL URETEROSCOPY/LASER LITHOTRIPSY ON RIGHT;  AND BIL STENT PLACEMENT;  Surgeon: Jerilee FieldMatthew Cleven Jansma, MD;  [facility] [place];  Service: Urology;  Laterality: Bilateral;  . Refractive surgery Bilateral 05/2008    Home Medications:  Prescriptions prior to admission  Medication Sig Dispense Refill Last Dose  . allopurinol (ZYLOPRIM) 100 MG tablet Take 1 tablet (100 mg total) by mouth daily. (Patient taking differently: Take 100 mg by mouth every morning. ) 30 tablet 11 04/12/2014 at am  . docusate sodium (COLACE) 100 MG capsule Take 1 capsule (100 mg total) by mouth 2 (two) times daily as needed for mild constipation. 10 capsule 0 Past Week at Unknown time  . esomeprazole (NEXIUM) 20 MG capsule Take 20 mg by mouth every morning.   04/13/2014 at 0330  . flurbiprofen (ANSAID) 100 MG tablet Take 100 mg by mouth daily as needed (headache.).    Past Week at Unknown time  . gabapentin (NEURONTIN) 300 MG capsule Take 600 mg by mouth at bedtime.   Past Week at Unknown time  . hydrochlorothiazide (HYDRODIURIL) 12.5 MG tablet Take 1 tablet (12.5 mg total) by mouth daily. (Patient taking differently: Take 12.5 mg by mouth every morning. ) 30 tablet 11 04/12/2014 at am  . HYDROcodone-acetaminophen (NORCO/VICODIN) 5-325 MG per tablet Take 1  tablet by mouth every 6 (six) hours as needed for moderate pain. 30 tablet 0 04/12/2014 at Unknown time  . ibuprofen (ADVIL,MOTRIN) 200 MG tablet Take 200-400 mg by mouth every 6 (six) hours as needed for moderate pain.   Past Week at Unknown time  . Melatonin 3 MG CAPS Take 1 capsule by mouth at bedtime as needed (sleep). Four nights a week.    04/12/2014 at pm  . Meth-Hyo-M Bl-Na Phos-Ph Sal (URIBEL) 118 MG CAPS Take 1 capsule by mouth 3 (three) times daily.   04/12/2014 at 1300  . rizatriptan (MAXALT) 10 MG tablet Take 10 mg by mouth as needed for migraine. May repeat in 2 hours if needed   More than a month at Unknown time   Allergies:  Allergies  Allergen Reactions  . Pyridium [Phenazopyridine Hcl] Other (See Comments)    "METALIC TASTE IN MOUTH"     History reviewed. No pertinent family history. Social History:  reports that he has been smoking Cigars.  He has never used smokeless tobacco. He reports that he drinks alcohol. He reports that he does not use illicit drugs.  ROS: A complete review of systems was performed.  All systems are negative except for pertinent findings as noted. ROS   Physical Exam:  Vital signs in last 24 hours: Temp:  [97.6 F (36.4 C)] 97.6 F (36.4 C) (12/03 0532) Pulse Rate:  [78] 78 (12/03 0532) Resp:  [18] 18 (12/03 0532) BP: (119)/(73) 119/73 mmHg (12/03 0532) SpO2:  [100 %] 100 % (12/03 0532) Weight:  [87.998 kg (194 lb)] 87.998 kg (194 lb) (12/03 0541) General:  Alert and oriented, No acute distress HEENT: Normocephalic, atraumatic Neck: No JVD or lymphadenopathy Cardiovascular: Regular rate and rhythm Lungs: Regular rate and effort Abdomen: Soft, nontender, nondistended, no abdominal masses Back: No CVA tenderness Extremities: No edema Neurologic: Grossly intact  Laboratory Data:  No results found for this or any previous visit (from the past 24 hour(s)). No results found for this or any previous visit (from the past 240 hour(s)). Creatinine:  Recent Labs  04/10/14 0900  CREATININE 1.06    Impression/Assessment/plan: I discussed with the patient the nature, potential benefits, risks and alternatives to bilateral URS, HLL, stent exchange, including side effects of the proposed treatment, the likelihood of the patient achieving the goals of the procedure, and any potential  problems that might occur during the procedure or recuperation. All questions answered. Patient elects to proceed. Discussed I probably will not be able to get him 100% stone free but we hope to get close and significantly reduce his chance of having to pass a stone in the coming months.    Jerilee Fieldskridge, [place] 04/13/2014, 7:41 AM

## 2014-04-13 NOTE — Transfer of Care (Signed)
Immediate Anesthesia Transfer of Care Note  Patient: Raymond Chandler  Procedure(s) Performed: Procedure(s): BILATEAL URETEROSCOPY/HOLMIUM LASER/STENT PLACEMENT (Bilateral)  Patient Location: PACU  Anesthesia Type:General  Level of Consciousness: sedated and patient cooperative  Airway & Oxygen Therapy: Patient Spontanous Breathing and Patient connected to face mask oxygen  Post-op Assessment: Report given to PACU RN and Post -op Vital signs reviewed and stable  Post vital signs: Reviewed and stable  Complications: No apparent anesthesia complications

## 2014-04-13 NOTE — Op Note (Signed)
Preoperative diagnosis: Bilateral renal stones Postoperative diagnosis: Same  Procedure: Cystoscopy with right ureteroscopy, holmium laser lithotripsy, stone basket extraction, right ureteral stent exchange; Cystoscopy with left ureteroscopy, stone basket extraction, left ureteral stent exchange  Surgeon: Mena GoesEskridge  Anesthesia: Judd  Type of anesthesia: Gen.  Indication for procedure: Patient is a 33 year old male with bilateral nephrolithiasis, flank pain and frequent stone passage. Patient desired ureteroscopic treatment to reduce stone burden after completing metabolic evaluation. He was brought today to complete staged treatment after a right distal ureteroscopy for ureteral stone and bilateral pre-stenting.  Findings: Multiple bilateral stones. No significant remaining stones. 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 Normal ureters following all manipulation.  Description of procedure: After consent was obtained patient brought to the operating room. After adequate anesthesia his placed in lithotomy position and prepped and draped in the usual sterile fashion. A timeout was performed to confirm the patient and procedure. The cystoscope was passed per urethra and the right ureteral stent was grasped and removed through the urethral meatus. A sensor wire was advanced and coiled in the collecting system. A dual-lumen exchange catheter was then advanced and the Glidewire past is a second wire/safety wire. The dual-lumen was removed and over the sensor wire a medium access sheath was advanced without difficulty. The digital ureteroscope was then passed per urethra and with an engage basket the upper pole and lower poles were sequentially cleared. The larger stone in the middle pole was too large to pass through the proximal ureter therefore it was brought to an upper pole calyx and released. Here it was broken  up with the laser at a setting of 0.8 and 8. These pieces were then sequentially removed. Careful inspection of the collecting system noted there to be no other significant stones. The ureteroscope was then backed out and the proximal ureter inspected and noted to be normal. The ureteroscope was brought to the age of the access sheath and the ureteroscope and access sheath brought out together inspecting the ureter along the way which was noted to be stone free and without injury. The Glidewire was left in the right ureter. As the case progressed it became more difficult to slide the ureteroscope through the sheath as the tip of the ureteroscope was a bit soft and would accordion and slide along the sheath gripping it making scope passage slow.  The cystoscope was then passed in the left ureteral stent was grasped and removed through the urethral meatus through which a sensor wire was advanced.   the dual-lumen was then passed on the left side and a second sensor wire. The ureteral access sheath was passed without difficulty. Inspection of the left side noted there to be less stone burden. With the large stones in the left lower pole. The upper middle calyces were sequentially cleared of many small stones with the engage basket. The lower pole stone was approached and it actually broken to 3 pieces and did not require laser lithotripsy. The first piece was removed without difficulty. When I was removing the second piece I was insured the second piece was lodged in the ureter near the access sheath were the scope was again having problems sliding through the access sheath. To be certain I plan to cut the basket slide the scope out then put the scope back in adjacent to the basket and laser the stone. The basket was cut and the scope removed. I then straighten the  basket to pass the scope beside it but the basket easily came out intact with minimal resistance with the stone fragment. The ureteroscope was then passed  in the ureter was noted to be normal and without injury. The remaining piece was grasped and removed without difficulty. Now careful inspection of the collecting systems noted there to be no other significant stones. Again noted was several Randall's plaques and nephrocalcinosis appearance of the distal tubules in the papilla. The ureteroscope was backed out and the ureter inspected and noted to be normal. The ureteroscope was back to the edge of the access sheath in the access sheath and scope were removed together inspecting the ureter which again was noted to be normal without any fragments. The sensor wire was backloaded on the cystoscope and a 6 x 26 and a meter stent was advanced. The wire was removed with a good coil seen in the kidney and a good coil in the bladder. The scope was broken draining the bladder and removing the sheath leaving the string in place.   The Glidewire on the right side was now backloaded on the cystoscope and a 6 x 26 and a meter stent was advanced. The wire was removed with a good coil seen in the right kidney and a good coil in the bladder. Again the scope was broken and the bladder drained the sheath removed. Final inspection noted the stents to be in good position on fluoroscopy. Which also be noted no other significant stones were noted on fluoroscopy.   Lidocaine jelly was passed per urethra. The strings were secured to the patient. He was awakened and taken to the recovery room in stable condition.   Complications: None  Blood loss: Minimal   Specimens: Right renal stone fragments, left renal stone fragments-given to patient   Drains:  Right 6 x 26 cm ureteral stent with string  Left 6 x 26 cm ureteral stent with string

## 2014-04-13 NOTE — Discharge Instructions (Signed)
Ureteral Stent Implantation, Care After Refer to this sheet in the next few weeks. These instructions provide you with information on caring for yourself after your procedure. Your health care provider may also give you more specific instructions. Your treatment has been planned according to current medical practices, but problems sometimes occur. Call your health care provider if you have any problems or questions after your procedure. WHAT TO EXPECT AFTER THE PROCEDURE You should be back to normal activity within 48 hours after the procedure. Nausea and vomiting may occur and are commonly the result of anesthesia. It is common to experience sharp pain in the back or lower abdomen and penis with voiding. This is caused by movement of the ends of the stent with the act of urinating.It usually goes away within minutes after you have stopped urinating. HOME CARE INSTRUCTIONS Make sure to drink plenty of fluids. You may have small amounts of bleeding, causing your urine to be red. This is normal. Certain movements may trigger pain or a feeling that you need to urinate. You may be given medicines to prevent infection or bladder spasms. Be sure to take all medicines as directed. Only take over-the-counter or prescription medicines for pain, discomfort, or fever as directed by your health care provider. Do not take aspirin, as this can make bleeding worse.  REMOVAL OF THE STENTS: 1) Remove one stent on Monday morning, Apr 17, 2014 by pulling the knot/strings with slow steady pressure. If both stents come out it is OK. 2) remove the other stent Wednesday morning, Apr 19, 2014 by pulling the remaining knot/strings with slow steady pressure.   SEEK MEDICAL CARE IF:  You experience increasing pain.  Your pain medicine is not working. SEEK IMMEDIATE MEDICAL CARE IF:  Your urine is dark red or has blood clots.  You are leaking urine (incontinent).  You have a fever, chills, feeling sick to your stomach  (nausea), or vomiting.  Your pain is not relieved by pain medicine.  The end of the stent comes out of the urethra.  You are unable to urinate.     General Anesthesia, Care After Refer to this sheet in the next few weeks. These instructions provide you with information on caring for yourself after your procedure. Your health care provider may also give you more specific instructions. Your treatment has been planned according to current medical practices, but problems sometimes occur. Call your health care provider if you have any problems or questions after your procedure. WHAT TO EXPECT AFTER THE PROCEDURE After the procedure, it is typical to experience:  Sleepiness.  Nausea and vomiting. HOME CARE INSTRUCTIONS  For the first 24 hours after general anesthesia:  Have a responsible person with you.  Do not drive a car. If you are alone, do not take public transportation.  Do not drink alcohol.  Do not take medicine that has not been prescribed by your health care provider.  Do not sign important papers or make important decisions.  You may resume a normal diet and activities as directed by your health care provider.  Change bandages (dressings) as directed.  If you have questions or problems that seem related to general anesthesia, call the hospital and ask for the anesthetist or anesthesiologist on call. SEEK MEDICAL CARE IF:  You have nausea and vomiting that continue the day after anesthesia.  You develop a rash. SEEK IMMEDIATE MEDICAL CARE IF:   You have difficulty breathing.  You have chest pain.  You have any allergic  problems. Document Released: 08/04/2000 Document Revised: 05/03/2013 Document Reviewed: 11/11/2012 [facility] Patient Information 2015 [place], [place]. This information is not intended to replace advice given to you by your health care provider. Make sure you discuss any questions you have with your health care provider.

## 2014-04-13 NOTE — Anesthesia Postprocedure Evaluation (Signed)
  Anesthesia Post-op Note  Patient: Raymond Chandler  Procedure(s) Performed: Procedure(s) (LRB): BILATEAL URETEROSCOPY/HOLMIUM LASER/STENT PLACEMENT (Bilateral)  Patient Location: PACU  Anesthesia Type: General  Level of Consciousness: awake and alert   Airway and Oxygen Therapy: Patient Spontanous Breathing  Post-op Pain: mild  Post-op Assessment: Post-op Vital signs reviewed, Patient's Cardiovascular Status Stable, Respiratory Function Stable, Patent Airway and No signs of Nausea or vomiting  Last Vitals:  Filed Vitals:   04/13/14 1045  BP: 132/74  Pulse: 81  Temp:   Resp: 11    Post-op Vital Signs: stable   Complications: No apparent anesthesia complications

## 2015-08-16 DIAGNOSIS — G44209 Tension-type headache, unspecified, not intractable: Secondary | ICD-10-CM | POA: Diagnosis not present

## 2015-08-16 DIAGNOSIS — G43009 Migraine without aura, not intractable, without status migrainosus: Secondary | ICD-10-CM | POA: Diagnosis not present

## 2015-08-16 DIAGNOSIS — G43719 Chronic migraine without aura, intractable, without status migrainosus: Secondary | ICD-10-CM | POA: Diagnosis not present

## 2015-08-16 DIAGNOSIS — H53419 Scotoma involving central area, unspecified eye: Secondary | ICD-10-CM | POA: Diagnosis not present

## 2015-09-19 DIAGNOSIS — G43009 Migraine without aura, not intractable, without status migrainosus: Secondary | ICD-10-CM | POA: Diagnosis not present

## 2015-09-19 DIAGNOSIS — H53419 Scotoma involving central area, unspecified eye: Secondary | ICD-10-CM | POA: Diagnosis not present

## 2015-09-19 DIAGNOSIS — G44209 Tension-type headache, unspecified, not intractable: Secondary | ICD-10-CM | POA: Diagnosis not present

## 2015-09-19 DIAGNOSIS — G43719 Chronic migraine without aura, intractable, without status migrainosus: Secondary | ICD-10-CM | POA: Diagnosis not present

## 2015-11-20 DIAGNOSIS — H5203 Hypermetropia, bilateral: Secondary | ICD-10-CM | POA: Diagnosis not present

## 2015-12-06 DIAGNOSIS — H8110 Benign paroxysmal vertigo, unspecified ear: Secondary | ICD-10-CM | POA: Diagnosis not present

## 2015-12-20 DIAGNOSIS — G43719 Chronic migraine without aura, intractable, without status migrainosus: Secondary | ICD-10-CM | POA: Diagnosis not present

## 2015-12-20 DIAGNOSIS — H8193 Unspecified disorder of vestibular function, bilateral: Secondary | ICD-10-CM | POA: Diagnosis not present

## 2015-12-20 DIAGNOSIS — H53419 Scotoma involving central area, unspecified eye: Secondary | ICD-10-CM | POA: Diagnosis not present

## 2015-12-20 DIAGNOSIS — G43009 Migraine without aura, not intractable, without status migrainosus: Secondary | ICD-10-CM | POA: Diagnosis not present

## 2015-12-28 DIAGNOSIS — H53143 Visual discomfort, bilateral: Secondary | ICD-10-CM | POA: Diagnosis not present

## 2016-03-27 DIAGNOSIS — H532 Diplopia: Secondary | ICD-10-CM | POA: Diagnosis not present

## 2016-03-27 DIAGNOSIS — G589 Mononeuropathy, unspecified: Secondary | ICD-10-CM | POA: Diagnosis not present

## 2016-03-27 DIAGNOSIS — R5383 Other fatigue: Secondary | ICD-10-CM | POA: Diagnosis not present

## 2016-03-27 DIAGNOSIS — G43719 Chronic migraine without aura, intractable, without status migrainosus: Secondary | ICD-10-CM | POA: Diagnosis not present

## 2016-03-31 DIAGNOSIS — N200 Calculus of kidney: Secondary | ICD-10-CM | POA: Diagnosis not present

## 2016-03-31 DIAGNOSIS — R1084 Generalized abdominal pain: Secondary | ICD-10-CM | POA: Diagnosis not present

## 2016-04-10 DIAGNOSIS — H0014 Chalazion left upper eyelid: Secondary | ICD-10-CM | POA: Diagnosis not present

## 2016-05-24 IMAGING — MR MR MRA HEAD W/O CM
1 series · 22 of 48 positions shown · non-contrast
Comparison: MRI of the brain and pituitary 12/18/2012.

CLINICAL DATA: Chronic headaches and vision disturbance. Basilar
artery stenosis.

EXAM:
MRA HEAD WITHOUT CONTRAST
TECHNIQUE: Angiographic images of the Circle of Willis were obtained using MRA
technique without intravenous contrast.

[Series 3: [id number] · axial · 0.7mm · 0.35mm/px · z∈[-61,+37]mm · 22 of 149 slices shown]
[im 1/149]
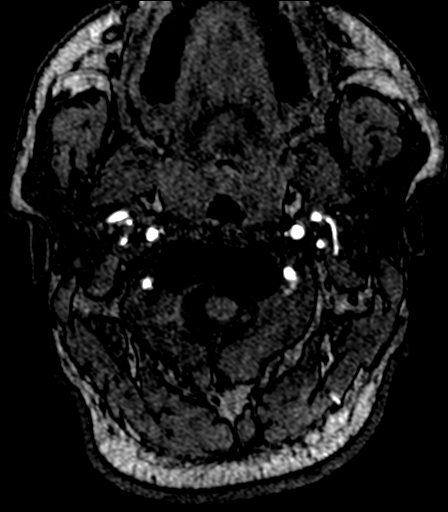
[im 4/149]
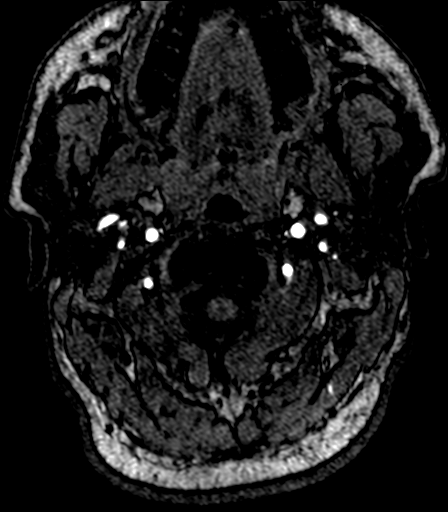
[im 7/149]
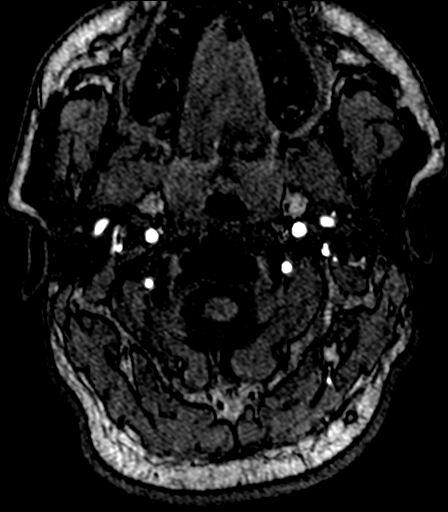
[im 10/149]
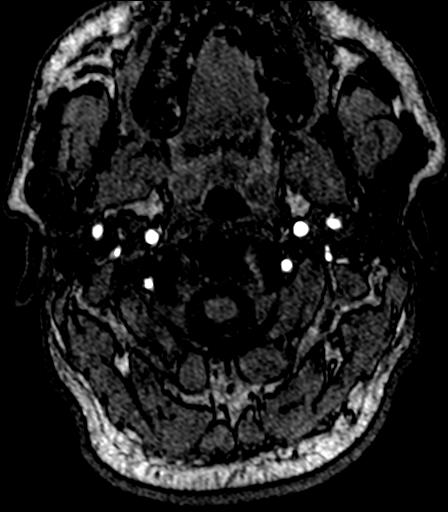
[im 13/149]
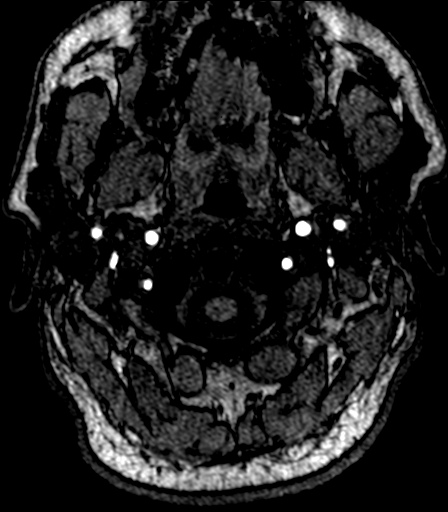
[im 16/149]
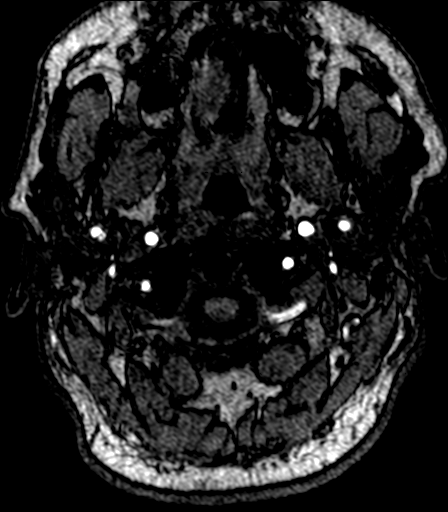
[im 19/149]
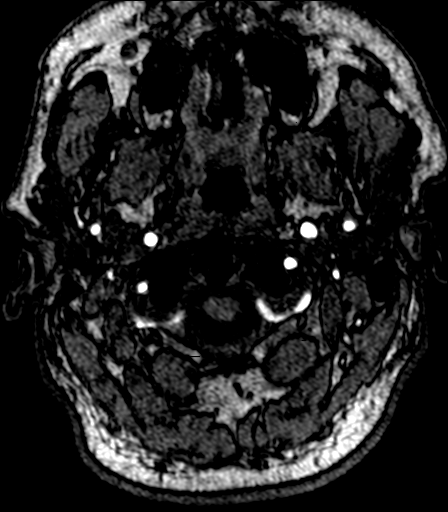
[im 23/149]
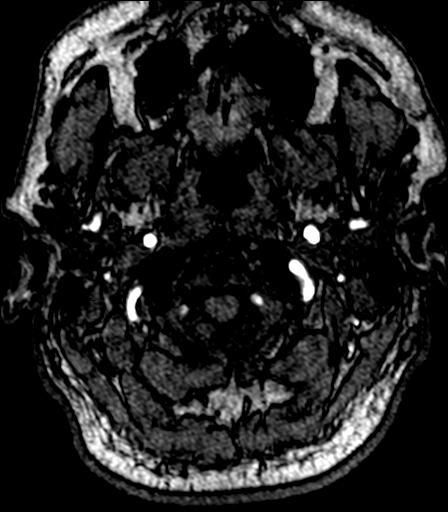
[im 26/149]
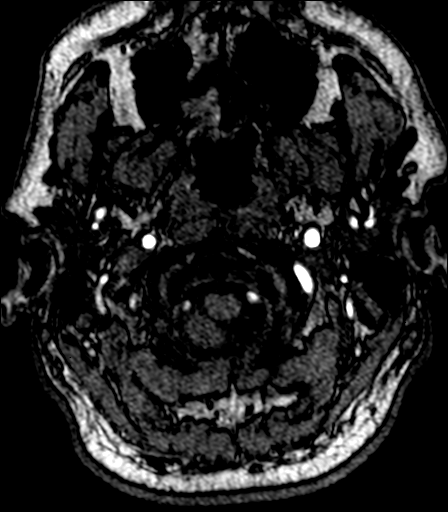
[im 29/149]
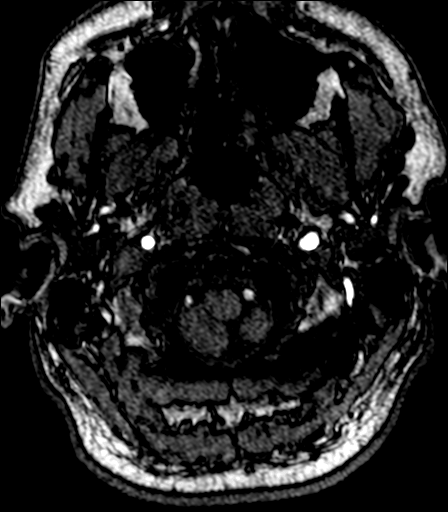
[im 32/149]
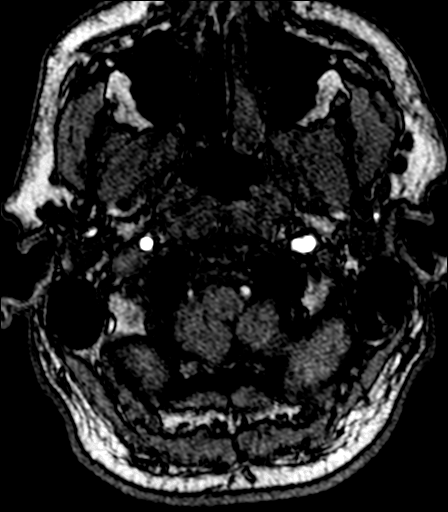
[im 35/149]
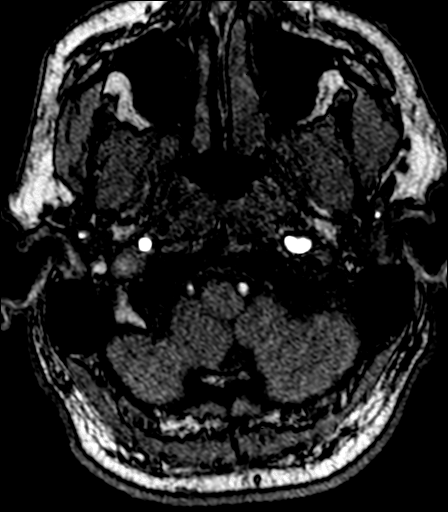
[im 38/149]
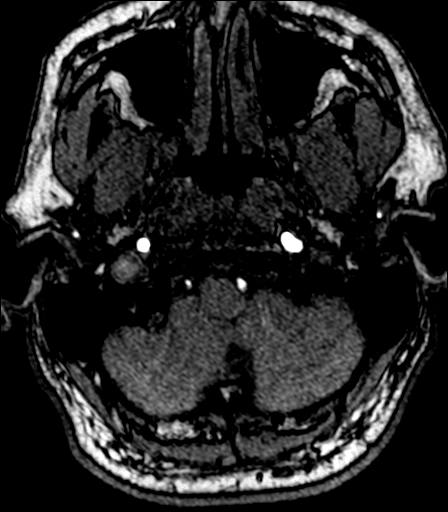
[im 41/149]
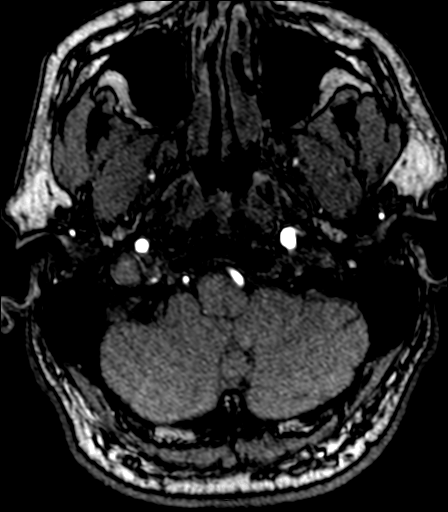
[im 48/149]
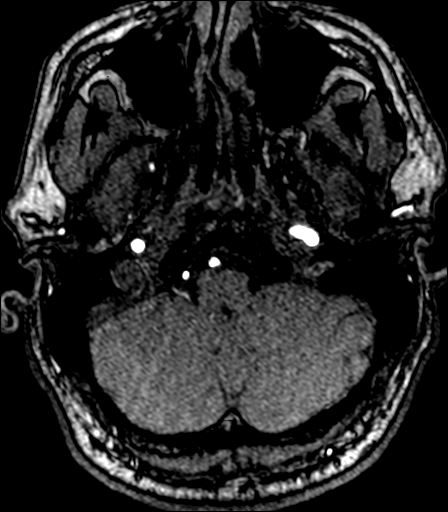
[im 67/149]
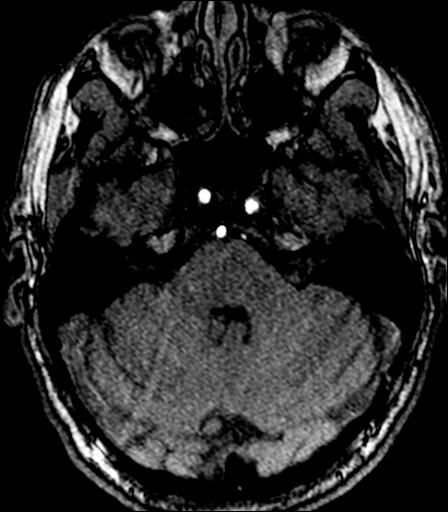
[im 76/149]
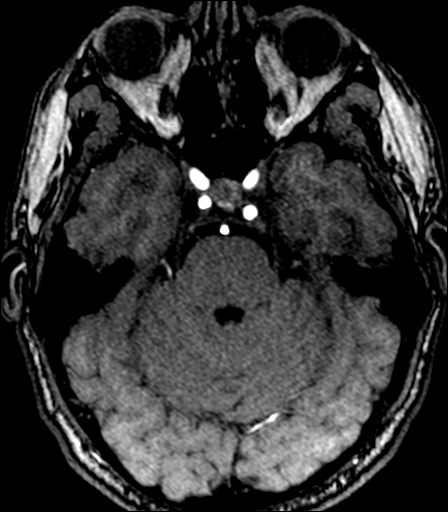
[im 86/149]
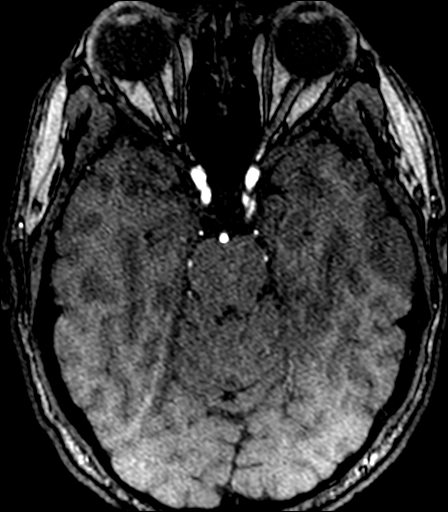
[im 104/149]
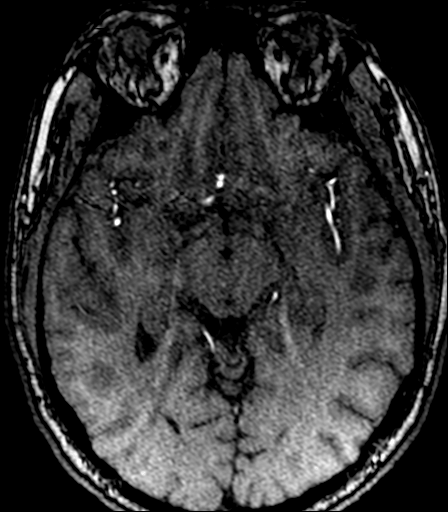
[im 123/149]
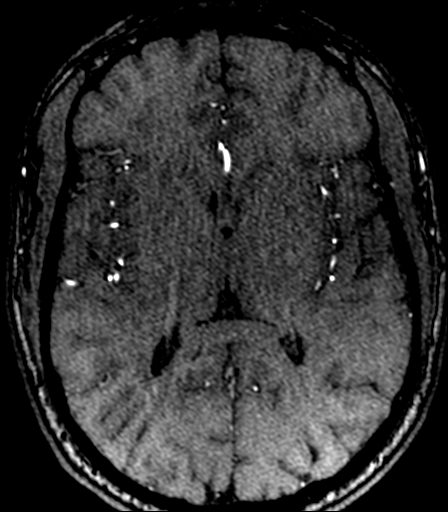
[im 126/149]
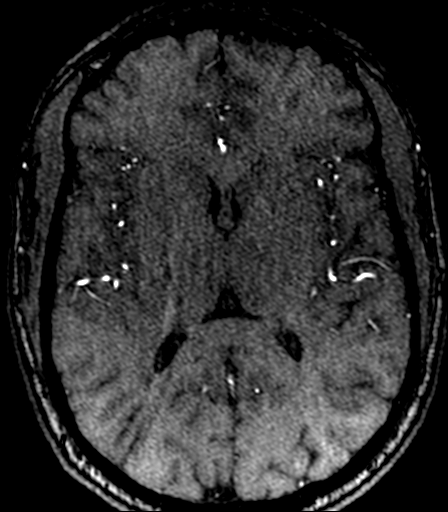
[im 142/149]
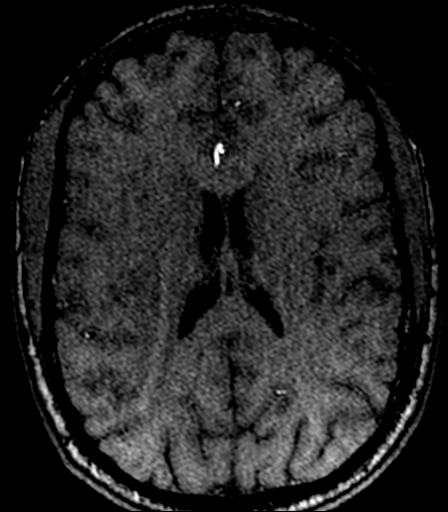

[22 of 48 positions shown; findings below may reference images not displayed]

FINDINGS: There is mild tortuosity of the cervical left ICA. The internal
carotid arteries are otherwise within normal limits to the ICA
termini bilaterally. The left A1 segment is dominant. The anterior
communicating artery is patent. A1 and M1 segments are otherwise
normal. The MCA bifurcations are intact. There is moderate
attenuation of MCA branch vessels bilaterally, more so on the left.
The ACA branch vessels are within normal limits.

The left vertebral artery is dominant. Atherosclerotic narrowing is
present in the proximal right vertebral artery. The vertebrobasilar
junction is within normal limits. The right PICA origin is
visualized and normal. The right AICA is noted as well. The left
AICA is dominant. The left PICA is not visualized. The basilar
artery is otherwise within normal limits. The right posterior
cerebral artery originates from the basilar tip. The left posterior
cerebral artery is fed by a left P1 segment as well as the posterior
communicating artery. There is some attenuation of distal PCA branch
vessels, worse on the left.
IMPRESSION: 1. Moderate medium and small vessel disease bilaterally.
2. Atherosclerotic changes within the right vertebral artery without
a significant stenosis relative to the more distal vessel.
3. Tortuous cervical left internal carotid artery without
significant stenosis. This is nonspecific but most commonly seen in
the setting of hypertension.

## 2016-06-19 DIAGNOSIS — H532 Diplopia: Secondary | ICD-10-CM | POA: Diagnosis not present

## 2016-06-19 DIAGNOSIS — R5383 Other fatigue: Secondary | ICD-10-CM | POA: Diagnosis not present

## 2016-06-19 DIAGNOSIS — G43719 Chronic migraine without aura, intractable, without status migrainosus: Secondary | ICD-10-CM | POA: Diagnosis not present

## 2016-09-11 DIAGNOSIS — H532 Diplopia: Secondary | ICD-10-CM | POA: Diagnosis not present

## 2016-09-11 DIAGNOSIS — G43719 Chronic migraine without aura, intractable, without status migrainosus: Secondary | ICD-10-CM | POA: Diagnosis not present

## 2016-09-11 DIAGNOSIS — R5383 Other fatigue: Secondary | ICD-10-CM | POA: Diagnosis not present

## 2016-11-20 DIAGNOSIS — G43909 Migraine, unspecified, not intractable, without status migrainosus: Secondary | ICD-10-CM | POA: Diagnosis not present

## 2016-11-20 DIAGNOSIS — N200 Calculus of kidney: Secondary | ICD-10-CM | POA: Diagnosis not present

## 2016-11-20 DIAGNOSIS — D352 Benign neoplasm of pituitary gland: Secondary | ICD-10-CM | POA: Diagnosis not present

## 2016-11-28 DIAGNOSIS — H52223 Regular astigmatism, bilateral: Secondary | ICD-10-CM | POA: Diagnosis not present

## 2016-12-18 DIAGNOSIS — G43719 Chronic migraine without aura, intractable, without status migrainosus: Secondary | ICD-10-CM | POA: Diagnosis not present

## 2016-12-18 DIAGNOSIS — M5481 Occipital neuralgia: Secondary | ICD-10-CM | POA: Diagnosis not present

## 2016-12-18 DIAGNOSIS — R5383 Other fatigue: Secondary | ICD-10-CM | POA: Diagnosis not present

## 2016-12-18 DIAGNOSIS — H532 Diplopia: Secondary | ICD-10-CM | POA: Diagnosis not present

## 2017-01-01 DIAGNOSIS — M5481 Occipital neuralgia: Secondary | ICD-10-CM | POA: Diagnosis not present

## 2017-01-01 DIAGNOSIS — G518 Other disorders of facial nerve: Secondary | ICD-10-CM | POA: Diagnosis not present

## 2017-01-01 DIAGNOSIS — H532 Diplopia: Secondary | ICD-10-CM | POA: Diagnosis not present

## 2017-01-01 DIAGNOSIS — G43719 Chronic migraine without aura, intractable, without status migrainosus: Secondary | ICD-10-CM | POA: Diagnosis not present

## 2017-02-26 DIAGNOSIS — R5383 Other fatigue: Secondary | ICD-10-CM | POA: Diagnosis not present

## 2017-02-26 DIAGNOSIS — M542 Cervicalgia: Secondary | ICD-10-CM | POA: Diagnosis not present

## 2017-02-26 DIAGNOSIS — M5481 Occipital neuralgia: Secondary | ICD-10-CM | POA: Diagnosis not present

## 2017-02-26 DIAGNOSIS — H532 Diplopia: Secondary | ICD-10-CM | POA: Diagnosis not present

## 2017-02-26 DIAGNOSIS — G43719 Chronic migraine without aura, intractable, without status migrainosus: Secondary | ICD-10-CM | POA: Diagnosis not present

## 2017-03-04 DIAGNOSIS — G43909 Migraine, unspecified, not intractable, without status migrainosus: Secondary | ICD-10-CM | POA: Diagnosis not present

## 2017-03-04 DIAGNOSIS — K219 Gastro-esophageal reflux disease without esophagitis: Secondary | ICD-10-CM | POA: Diagnosis not present

## 2017-03-04 DIAGNOSIS — F331 Major depressive disorder, recurrent, moderate: Secondary | ICD-10-CM | POA: Diagnosis not present

## 2017-07-21 DIAGNOSIS — D2372 Other benign neoplasm of skin of left lower limb, including hip: Secondary | ICD-10-CM | POA: Diagnosis not present

## 2017-07-21 DIAGNOSIS — D2371 Other benign neoplasm of skin of right lower limb, including hip: Secondary | ICD-10-CM | POA: Diagnosis not present

## 2017-07-21 DIAGNOSIS — M722 Plantar fascial fibromatosis: Secondary | ICD-10-CM | POA: Diagnosis not present

## 2017-07-27 DIAGNOSIS — N200 Calculus of kidney: Secondary | ICD-10-CM | POA: Diagnosis not present

## 2017-07-27 DIAGNOSIS — N5201 Erectile dysfunction due to arterial insufficiency: Secondary | ICD-10-CM | POA: Diagnosis not present

## 2017-09-24 DIAGNOSIS — K219 Gastro-esophageal reflux disease without esophagitis: Secondary | ICD-10-CM | POA: Diagnosis not present

## 2017-09-24 DIAGNOSIS — R197 Diarrhea, unspecified: Secondary | ICD-10-CM | POA: Diagnosis not present

## 2017-09-24 DIAGNOSIS — R12 Heartburn: Secondary | ICD-10-CM | POA: Diagnosis not present

## 2017-09-24 DIAGNOSIS — R0789 Other chest pain: Secondary | ICD-10-CM | POA: Diagnosis not present

## 2017-09-24 DIAGNOSIS — R1084 Generalized abdominal pain: Secondary | ICD-10-CM | POA: Diagnosis not present

## 2017-09-28 DIAGNOSIS — N200 Calculus of kidney: Secondary | ICD-10-CM | POA: Diagnosis not present

## 2017-10-16 DIAGNOSIS — K219 Gastro-esophageal reflux disease without esophagitis: Secondary | ICD-10-CM | POA: Diagnosis not present

## 2017-10-22 DIAGNOSIS — G43719 Chronic migraine without aura, intractable, without status migrainosus: Secondary | ICD-10-CM | POA: Diagnosis not present

## 2017-10-22 DIAGNOSIS — M542 Cervicalgia: Secondary | ICD-10-CM | POA: Diagnosis not present

## 2017-10-22 DIAGNOSIS — G518 Other disorders of facial nerve: Secondary | ICD-10-CM | POA: Diagnosis not present

## 2017-10-22 DIAGNOSIS — M5481 Occipital neuralgia: Secondary | ICD-10-CM | POA: Diagnosis not present

## 2017-10-28 DIAGNOSIS — N5201 Erectile dysfunction due to arterial insufficiency: Secondary | ICD-10-CM | POA: Diagnosis not present

## 2017-10-28 DIAGNOSIS — N200 Calculus of kidney: Secondary | ICD-10-CM | POA: Diagnosis not present

## 2017-11-05 DIAGNOSIS — R12 Heartburn: Secondary | ICD-10-CM | POA: Diagnosis not present

## 2017-11-05 DIAGNOSIS — K2930 Chronic superficial gastritis without bleeding: Secondary | ICD-10-CM | POA: Diagnosis not present

## 2017-11-05 DIAGNOSIS — K219 Gastro-esophageal reflux disease without esophagitis: Secondary | ICD-10-CM | POA: Diagnosis not present

## 2017-11-09 DIAGNOSIS — K2930 Chronic superficial gastritis without bleeding: Secondary | ICD-10-CM | POA: Diagnosis not present

## 2017-12-07 DIAGNOSIS — R112 Nausea with vomiting, unspecified: Secondary | ICD-10-CM | POA: Diagnosis not present

## 2017-12-07 DIAGNOSIS — R12 Heartburn: Secondary | ICD-10-CM | POA: Diagnosis not present

## 2017-12-14 DIAGNOSIS — K219 Gastro-esophageal reflux disease without esophagitis: Secondary | ICD-10-CM | POA: Diagnosis not present

## 2017-12-14 DIAGNOSIS — R112 Nausea with vomiting, unspecified: Secondary | ICD-10-CM | POA: Diagnosis not present

## 2017-12-14 DIAGNOSIS — R194 Change in bowel habit: Secondary | ICD-10-CM | POA: Diagnosis not present

## 2017-12-14 DIAGNOSIS — R1011 Right upper quadrant pain: Secondary | ICD-10-CM | POA: Diagnosis not present

## 2017-12-28 DIAGNOSIS — N200 Calculus of kidney: Secondary | ICD-10-CM | POA: Diagnosis not present

## 2017-12-28 DIAGNOSIS — R1011 Right upper quadrant pain: Secondary | ICD-10-CM | POA: Diagnosis not present

## 2017-12-28 DIAGNOSIS — R112 Nausea with vomiting, unspecified: Secondary | ICD-10-CM | POA: Diagnosis not present

## 2018-01-01 DIAGNOSIS — R1013 Epigastric pain: Secondary | ICD-10-CM | POA: Diagnosis not present

## 2018-01-20 DIAGNOSIS — R110 Nausea: Secondary | ICD-10-CM | POA: Diagnosis not present

## 2018-01-20 DIAGNOSIS — R112 Nausea with vomiting, unspecified: Secondary | ICD-10-CM | POA: Diagnosis not present

## 2018-01-20 DIAGNOSIS — K219 Gastro-esophageal reflux disease without esophagitis: Secondary | ICD-10-CM | POA: Diagnosis not present

## 2018-01-20 DIAGNOSIS — K1379 Other lesions of oral mucosa: Secondary | ICD-10-CM | POA: Diagnosis not present

## 2018-01-26 ENCOUNTER — Other Ambulatory Visit: Payer: Self-pay | Admitting: Internal Medicine

## 2018-01-26 DIAGNOSIS — R110 Nausea: Secondary | ICD-10-CM

## 2018-03-25 DIAGNOSIS — M542 Cervicalgia: Secondary | ICD-10-CM | POA: Diagnosis not present

## 2018-03-25 DIAGNOSIS — G518 Other disorders of facial nerve: Secondary | ICD-10-CM | POA: Diagnosis not present

## 2018-03-25 DIAGNOSIS — M5481 Occipital neuralgia: Secondary | ICD-10-CM | POA: Diagnosis not present

## 2018-03-25 DIAGNOSIS — G43719 Chronic migraine without aura, intractable, without status migrainosus: Secondary | ICD-10-CM | POA: Diagnosis not present

## 2018-04-22 DIAGNOSIS — N200 Calculus of kidney: Secondary | ICD-10-CM | POA: Diagnosis not present

## 2018-04-22 DIAGNOSIS — N5201 Erectile dysfunction due to arterial insufficiency: Secondary | ICD-10-CM | POA: Diagnosis not present

## 2018-10-21 DIAGNOSIS — G518 Other disorders of facial nerve: Secondary | ICD-10-CM | POA: Diagnosis not present

## 2018-10-21 DIAGNOSIS — M5481 Occipital neuralgia: Secondary | ICD-10-CM | POA: Diagnosis not present

## 2018-10-21 DIAGNOSIS — M542 Cervicalgia: Secondary | ICD-10-CM | POA: Diagnosis not present

## 2018-10-21 DIAGNOSIS — G43719 Chronic migraine without aura, intractable, without status migrainosus: Secondary | ICD-10-CM | POA: Diagnosis not present

## 2018-11-10 DIAGNOSIS — Z20828 Contact with and (suspected) exposure to other viral communicable diseases: Secondary | ICD-10-CM | POA: Diagnosis not present

## 2019-01-24 DIAGNOSIS — H9311 Tinnitus, right ear: Secondary | ICD-10-CM | POA: Diagnosis not present

## 2019-01-24 DIAGNOSIS — Z6828 Body mass index (BMI) 28.0-28.9, adult: Secondary | ICD-10-CM | POA: Diagnosis not present

## 2019-01-24 DIAGNOSIS — H6090 Unspecified otitis externa, unspecified ear: Secondary | ICD-10-CM | POA: Diagnosis not present

## 2019-02-14 DIAGNOSIS — R509 Fever, unspecified: Secondary | ICD-10-CM | POA: Diagnosis not present

## 2019-02-14 DIAGNOSIS — J989 Respiratory disorder, unspecified: Secondary | ICD-10-CM | POA: Diagnosis not present

## 2019-02-24 DIAGNOSIS — N200 Calculus of kidney: Secondary | ICD-10-CM | POA: Diagnosis not present

## 2019-02-24 DIAGNOSIS — R3121 Asymptomatic microscopic hematuria: Secondary | ICD-10-CM | POA: Diagnosis not present

## 2019-02-24 DIAGNOSIS — N5201 Erectile dysfunction due to arterial insufficiency: Secondary | ICD-10-CM | POA: Diagnosis not present

## 2019-02-24 DIAGNOSIS — R102 Pelvic and perineal pain: Secondary | ICD-10-CM | POA: Diagnosis not present

## 2019-03-02 DIAGNOSIS — M26621 Arthralgia of right temporomandibular joint: Secondary | ICD-10-CM | POA: Diagnosis not present

## 2019-03-02 DIAGNOSIS — F17290 Nicotine dependence, other tobacco product, uncomplicated: Secondary | ICD-10-CM | POA: Diagnosis not present

## 2019-03-02 DIAGNOSIS — J343 Hypertrophy of nasal turbinates: Secondary | ICD-10-CM | POA: Diagnosis not present

## 2019-03-02 DIAGNOSIS — H9311 Tinnitus, right ear: Secondary | ICD-10-CM | POA: Diagnosis not present

## 2019-03-02 DIAGNOSIS — H9313 Tinnitus, bilateral: Secondary | ICD-10-CM | POA: Diagnosis not present

## 2019-06-02 DIAGNOSIS — G43719 Chronic migraine without aura, intractable, without status migrainosus: Secondary | ICD-10-CM | POA: Diagnosis not present

## 2019-06-02 DIAGNOSIS — M5481 Occipital neuralgia: Secondary | ICD-10-CM | POA: Diagnosis not present

## 2019-06-02 DIAGNOSIS — M542 Cervicalgia: Secondary | ICD-10-CM | POA: Diagnosis not present

## 2019-08-09 DIAGNOSIS — L659 Nonscarring hair loss, unspecified: Secondary | ICD-10-CM | POA: Diagnosis not present

## 2019-09-08 DIAGNOSIS — N200 Calculus of kidney: Secondary | ICD-10-CM | POA: Diagnosis not present

## 2019-09-08 DIAGNOSIS — R3121 Asymptomatic microscopic hematuria: Secondary | ICD-10-CM | POA: Diagnosis not present

## 2019-09-08 DIAGNOSIS — R8271 Bacteriuria: Secondary | ICD-10-CM | POA: Diagnosis not present

## 2019-09-08 DIAGNOSIS — R102 Pelvic and perineal pain: Secondary | ICD-10-CM | POA: Diagnosis not present

## 2019-09-20 DIAGNOSIS — R3121 Asymptomatic microscopic hematuria: Secondary | ICD-10-CM | POA: Diagnosis not present

## 2019-09-20 DIAGNOSIS — N200 Calculus of kidney: Secondary | ICD-10-CM | POA: Diagnosis not present

## 2019-11-10 DIAGNOSIS — B351 Tinea unguium: Secondary | ICD-10-CM | POA: Diagnosis not present

## 2020-02-01 DIAGNOSIS — N5201 Erectile dysfunction due to arterial insufficiency: Secondary | ICD-10-CM | POA: Diagnosis not present

## 2020-02-01 DIAGNOSIS — N200 Calculus of kidney: Secondary | ICD-10-CM | POA: Diagnosis not present

## 2020-03-16 DIAGNOSIS — B309 Viral conjunctivitis, unspecified: Secondary | ICD-10-CM | POA: Diagnosis not present

## 2020-05-24 DIAGNOSIS — H04129 Dry eye syndrome of unspecified lacrimal gland: Secondary | ICD-10-CM | POA: Diagnosis not present

## 2020-05-24 DIAGNOSIS — R112 Nausea with vomiting, unspecified: Secondary | ICD-10-CM | POA: Diagnosis not present

## 2020-07-12 DIAGNOSIS — G43719 Chronic migraine without aura, intractable, without status migrainosus: Secondary | ICD-10-CM | POA: Diagnosis not present

## 2020-07-12 DIAGNOSIS — M5481 Occipital neuralgia: Secondary | ICD-10-CM | POA: Diagnosis not present

## 2020-07-12 DIAGNOSIS — Z79899 Other long term (current) drug therapy: Secondary | ICD-10-CM | POA: Diagnosis not present

## 2020-07-12 DIAGNOSIS — M542 Cervicalgia: Secondary | ICD-10-CM | POA: Diagnosis not present

## 2020-08-21 DIAGNOSIS — F411 Generalized anxiety disorder: Secondary | ICD-10-CM | POA: Diagnosis not present

## 2020-09-25 DIAGNOSIS — E291 Testicular hypofunction: Secondary | ICD-10-CM | POA: Diagnosis not present

## 2020-09-25 DIAGNOSIS — F411 Generalized anxiety disorder: Secondary | ICD-10-CM | POA: Diagnosis not present

## 2020-09-25 DIAGNOSIS — G43709 Chronic migraine without aura, not intractable, without status migrainosus: Secondary | ICD-10-CM | POA: Diagnosis not present

## 2020-10-15 DIAGNOSIS — H1089 Other conjunctivitis: Secondary | ICD-10-CM | POA: Diagnosis not present

## 2020-11-05 DIAGNOSIS — H04123 Dry eye syndrome of bilateral lacrimal glands: Secondary | ICD-10-CM | POA: Diagnosis not present

## 2020-11-06 DIAGNOSIS — M5481 Occipital neuralgia: Secondary | ICD-10-CM | POA: Diagnosis not present

## 2020-11-06 DIAGNOSIS — G44209 Tension-type headache, unspecified, not intractable: Secondary | ICD-10-CM | POA: Diagnosis not present

## 2020-11-06 DIAGNOSIS — F411 Generalized anxiety disorder: Secondary | ICD-10-CM | POA: Diagnosis not present

## 2020-11-06 DIAGNOSIS — Z13220 Encounter for screening for lipoid disorders: Secondary | ICD-10-CM | POA: Diagnosis not present

## 2020-11-06 DIAGNOSIS — G43019 Migraine without aura, intractable, without status migrainosus: Secondary | ICD-10-CM | POA: Diagnosis not present

## 2020-12-03 DIAGNOSIS — H0288B Meibomian gland dysfunction left eye, upper and lower eyelids: Secondary | ICD-10-CM | POA: Diagnosis not present

## 2021-01-25 DIAGNOSIS — H5712 Ocular pain, left eye: Secondary | ICD-10-CM | POA: Diagnosis not present

## 2021-02-14 DIAGNOSIS — N200 Calculus of kidney: Secondary | ICD-10-CM | POA: Diagnosis not present

## 2021-02-14 DIAGNOSIS — N5201 Erectile dysfunction due to arterial insufficiency: Secondary | ICD-10-CM | POA: Diagnosis not present

## 2021-02-26 DIAGNOSIS — G43709 Chronic migraine without aura, not intractable, without status migrainosus: Secondary | ICD-10-CM | POA: Diagnosis not present

## 2021-02-26 DIAGNOSIS — F411 Generalized anxiety disorder: Secondary | ICD-10-CM | POA: Diagnosis not present

## 2021-06-11 DIAGNOSIS — Z79899 Other long term (current) drug therapy: Secondary | ICD-10-CM | POA: Diagnosis not present

## 2021-06-11 DIAGNOSIS — F411 Generalized anxiety disorder: Secondary | ICD-10-CM | POA: Diagnosis not present

## 2021-07-11 DIAGNOSIS — M5481 Occipital neuralgia: Secondary | ICD-10-CM | POA: Diagnosis not present

## 2021-07-11 DIAGNOSIS — M542 Cervicalgia: Secondary | ICD-10-CM | POA: Diagnosis not present

## 2021-07-11 DIAGNOSIS — G43719 Chronic migraine without aura, intractable, without status migrainosus: Secondary | ICD-10-CM | POA: Diagnosis not present

## 2021-08-14 DIAGNOSIS — N200 Calculus of kidney: Secondary | ICD-10-CM | POA: Diagnosis not present

## 2021-08-20 DIAGNOSIS — H532 Diplopia: Secondary | ICD-10-CM | POA: Diagnosis not present

## 2021-09-02 DIAGNOSIS — H532 Diplopia: Secondary | ICD-10-CM | POA: Diagnosis not present

## 2021-09-02 DIAGNOSIS — E236 Other disorders of pituitary gland: Secondary | ICD-10-CM | POA: Diagnosis not present

## 2021-09-17 ENCOUNTER — Telehealth: Payer: Self-pay

## 2021-09-17 NOTE — Telephone Encounter (Signed)
Left message with [facility] referrals regarding urgent referral for Diplopia.  [facility] is not able to accommodate urgent referrals. ?

## 2021-10-09 DIAGNOSIS — R748 Abnormal levels of other serum enzymes: Secondary | ICD-10-CM | POA: Diagnosis not present

## 2021-10-09 DIAGNOSIS — E291 Testicular hypofunction: Secondary | ICD-10-CM | POA: Diagnosis not present

## 2021-10-09 DIAGNOSIS — E236 Other disorders of pituitary gland: Secondary | ICD-10-CM | POA: Diagnosis not present

## 2021-10-09 DIAGNOSIS — N62 Hypertrophy of breast: Secondary | ICD-10-CM | POA: Diagnosis not present

## 2021-10-14 DIAGNOSIS — D352 Benign neoplasm of pituitary gland: Secondary | ICD-10-CM | POA: Diagnosis not present

## 2021-10-14 DIAGNOSIS — N200 Calculus of kidney: Secondary | ICD-10-CM | POA: Diagnosis not present

## 2021-10-14 DIAGNOSIS — R110 Nausea: Secondary | ICD-10-CM | POA: Diagnosis not present

## 2021-10-14 DIAGNOSIS — G43909 Migraine, unspecified, not intractable, without status migrainosus: Secondary | ICD-10-CM | POA: Diagnosis not present

## 2022-03-18 DIAGNOSIS — F331 Major depressive disorder, recurrent, moderate: Secondary | ICD-10-CM | POA: Diagnosis not present

## 2022-03-18 DIAGNOSIS — G43019 Migraine without aura, intractable, without status migrainosus: Secondary | ICD-10-CM | POA: Diagnosis not present

## 2022-03-18 DIAGNOSIS — E236 Other disorders of pituitary gland: Secondary | ICD-10-CM | POA: Diagnosis not present

## 2022-03-18 DIAGNOSIS — E291 Testicular hypofunction: Secondary | ICD-10-CM | POA: Diagnosis not present

## 2022-03-18 DIAGNOSIS — M5481 Occipital neuralgia: Secondary | ICD-10-CM | POA: Diagnosis not present

## 2022-04-10 DIAGNOSIS — N200 Calculus of kidney: Secondary | ICD-10-CM | POA: Diagnosis not present

## 2022-04-10 DIAGNOSIS — N5201 Erectile dysfunction due to arterial insufficiency: Secondary | ICD-10-CM | POA: Diagnosis not present

## 2022-05-27 DIAGNOSIS — J069 Acute upper respiratory infection, unspecified: Secondary | ICD-10-CM | POA: Diagnosis not present

## 2022-05-27 DIAGNOSIS — J029 Acute pharyngitis, unspecified: Secondary | ICD-10-CM | POA: Diagnosis not present

## 2022-06-25 DIAGNOSIS — N200 Calculus of kidney: Secondary | ICD-10-CM | POA: Diagnosis not present

## 2022-10-02 DIAGNOSIS — H532 Diplopia: Secondary | ICD-10-CM | POA: Diagnosis not present

## 2022-10-07 DIAGNOSIS — G43019 Migraine without aura, intractable, without status migrainosus: Secondary | ICD-10-CM | POA: Diagnosis not present

## 2022-10-07 DIAGNOSIS — R42 Dizziness and giddiness: Secondary | ICD-10-CM | POA: Diagnosis not present

## 2022-10-07 DIAGNOSIS — Z6826 Body mass index (BMI) 26.0-26.9, adult: Secondary | ICD-10-CM | POA: Diagnosis not present

## 2022-10-13 DIAGNOSIS — G43719 Chronic migraine without aura, intractable, without status migrainosus: Secondary | ICD-10-CM | POA: Diagnosis not present

## 2022-10-13 DIAGNOSIS — M5481 Occipital neuralgia: Secondary | ICD-10-CM | POA: Diagnosis not present

## 2022-10-13 DIAGNOSIS — M542 Cervicalgia: Secondary | ICD-10-CM | POA: Diagnosis not present

## 2022-10-18 DIAGNOSIS — J020 Streptococcal pharyngitis: Secondary | ICD-10-CM | POA: Diagnosis not present

## 2022-10-18 DIAGNOSIS — W57XXXA Bitten or stung by nonvenomous insect and other nonvenomous arthropods, initial encounter: Secondary | ICD-10-CM | POA: Diagnosis not present

## 2022-11-20 DIAGNOSIS — G43019 Migraine without aura, intractable, without status migrainosus: Secondary | ICD-10-CM | POA: Diagnosis not present

## 2022-11-20 DIAGNOSIS — H532 Diplopia: Secondary | ICD-10-CM | POA: Diagnosis not present

## 2022-11-20 DIAGNOSIS — K219 Gastro-esophageal reflux disease without esophagitis: Secondary | ICD-10-CM | POA: Diagnosis not present

## 2022-12-01 DIAGNOSIS — M5481 Occipital neuralgia: Secondary | ICD-10-CM | POA: Diagnosis not present

## 2022-12-01 DIAGNOSIS — M542 Cervicalgia: Secondary | ICD-10-CM | POA: Diagnosis not present

## 2022-12-01 DIAGNOSIS — G43719 Chronic migraine without aura, intractable, without status migrainosus: Secondary | ICD-10-CM | POA: Diagnosis not present

## 2022-12-05 ENCOUNTER — Other Ambulatory Visit: Payer: Self-pay | Admitting: Specialist

## 2022-12-05 DIAGNOSIS — H532 Diplopia: Secondary | ICD-10-CM

## 2022-12-08 ENCOUNTER — Ambulatory Visit
Admission: RE | Admit: 2022-12-08 | Discharge: 2022-12-08 | Disposition: A | Discharge status: Home / Self Care | Payer: Self-pay | Source: Ambulatory Visit | Attending: Specialist | Admitting: Specialist

## 2022-12-08 DIAGNOSIS — H532 Diplopia: Secondary | ICD-10-CM | POA: Diagnosis not present

## 2022-12-08 MED ORDER — GADOPICLENOL 0.5 MMOL/ML IV SOLN
9.0000 mL | Freq: Once | INTRAVENOUS | Status: AC | PRN
  Administered 2022-12-08: 9 mL via INTRAVENOUS

## 2022-12-22 DIAGNOSIS — G43719 Chronic migraine without aura, intractable, without status migrainosus: Secondary | ICD-10-CM | POA: Diagnosis not present

## 2022-12-22 DIAGNOSIS — M542 Cervicalgia: Secondary | ICD-10-CM | POA: Diagnosis not present

## 2022-12-22 DIAGNOSIS — F32A Depression, unspecified: Secondary | ICD-10-CM | POA: Diagnosis not present

## 2022-12-22 DIAGNOSIS — R451 Restlessness and agitation: Secondary | ICD-10-CM | POA: Diagnosis not present

## 2023-01-12 NOTE — Progress Notes (Unsigned)
[facility]  PATIENT: Raymond Chandler DOB: 1981-04-07  REFERRING DOCTOR OR PCP: Shireen Quan, DO SOURCE: Patient, notes from neurology, notes from primary care, imaging and lab reports, MRI images personally reviewed.  _________________________________   HISTORICAL  CHIEF COMPLAINT:  Chief Complaint  Patient presents with   New Patient (Initial Visit)    Rm 10, alone. Joshua,  chronic constant diploplia since 5 mon, chronic migraines.  Trying to get neuro-ophthalmologist [facility] or [facility]. Has seen Dr. Maryagnes [facility]    HISTORY OF PRESENT ILLNESS:  I had the pleasure of seeing your patient, Raymond Chandler, at [facility] for neurologic consultation regarding his diplopia.  He is a 42 yo man who has had migraine headaches since age 16.    He waa followed by [facility] and is on Aimovig, Nurtec and Fioricet.    He would have some ocular migraine and some visual disturbances that were more chronic.   He had seen Dr. Daphine Deutscher at [facility] for these issues and other ophthalmologist and never had an etiology.    He had the sudden onset of diplopia starting in May 2024.  He was advised to stop gabapentin.   Stopping that was tolerated but did not help the diplopia.   He also stopped or reduced other medications.   The diplopia is constant.  If he stretches his eyes, vision momentarily improves.   The diplopia is present in all gaze.  The separation increases as the day goes on.   He has been trying to get into ophthalmology, but an appointment has not been made yet.     He denies numbness or weakness in the face.   No change in voice.   His eyes are sometimes dry, especially when he wakes up (has had x many years).   He sometimes feels off balanced, especially on stairs.  No falls.     Chronic migraines are better on Aimovig with Nurtec/Maxalt for breakthrough  He was premature weighting < 3 pounds and requiring an incubator x 3 months.   He had neonatal jaundice and needed bilirubin lights.    He denies any head injuries.  He never had meningitis.   He did have shingles x 3 , once in the left side of face and neck, never in eye.      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   He feels less safe driving, especially later in the day.      Imaging: MRI of the brain 12/08/2022, MRI of the brain 12/18/2012 and MRI of the brain 08/30/2008 all show a T2/FLAIR hyperintense focus near the trigone of the right lateral ventricle.  This is nonspecific.  The brain was normal.  MRI angiogram 03/18/2014 showed some variant anatomy with a dominant left A1 segment left vertebral artery.  The left vertebral artery derives flow from both the anterior and posterior circulations left posterior cerebral artery derives flow from both the anterior and posterior circulations.  Some irregularity of right M2 segments, unclear if pathologic or artifact.  REVIEW OF SYSTEMS: Constitutional: No fevers, chills, sweats, or change in appetite Eyes: No visual changes.  He notes diplopia. Ear, nose and throat: No hearing loss, ear pain, nasal congestion, sore throat Cardiovascular: No chest pain, palpitations Respiratory:  No shortness of breath at rest or with exertion.   No wheezes GastrointestinaI: No nausea, vomiting, diarrhea, abdominal pain, fecal incontinence.  He has  GERD. Genitourinary:  No dysuria, urinary retention or frequency.  No nocturia.  He has a history of kidney stones. Musculoskeletal:  No neck pain, back pain Integumentary: No rash, pruritus, skin lesions Neurological: as above Psychiatric: No depression at this time.  No anxiety Endocrine: No palpitations, diaphoresis, change in appetite, change in weigh or increased thirst Hematologic/Lymphatic:  No anemia, purpura, petechiae. Allergic/Immunologic: No itchy/runny eyes, nasal congestion, recent allergic reactions,  rashes  ALLERGIES: Allergies  Allergen Reactions   Pyridium [Phenazopyridine Hcl] Other (See Comments)    "METALIC TASTE IN MOUTH"     HOME MEDICATIONS:  Current Outpatient Medications:    ACETAMINOPHEN-BUTALBITAL 50-325 MG TABS, Take by mouth., Disp: , Rfl:    allopurinol (ZYLOPRIM) 100 MG tablet, Take 1 tablet (100 mg total) by mouth daily. (Patient taking differently: Take 100 mg by mouth every morning.), Disp: 30 tablet, Rfl: 11   Erenumab-aooe (AIMOVIG) 140 MG/ML SOAJ, Inject 140 mg into the skin every 30 (thirty) days., Disp: , Rfl:    ibuprofen (ADVIL,MOTRIN) 200 MG tablet, Take 200-400 mg by mouth every 6 (six) hours as needed for moderate pain., Disp: , Rfl:    MELATONIN PO, Take 30 mg by mouth at bedtime. Takes Sunday thru Thursday, Disp: , Rfl:    RABEprazole (ACIPHEX) 20 MG tablet, Take 20 mg by mouth in the morning and at bedtime., Disp: , Rfl:    Rimegepant Sulfate (NURTEC) 75 MG TBDP, Take 75 mg by mouth as needed., Disp: , Rfl:    rizatriptan (MAXALT) 10 MG tablet, Take 10 mg by mouth as needed for migraine. May repeat in 2 hours if needed, Disp: , Rfl:    gabapentin (NEURONTIN) 300 MG capsule, Take 600 mg by mouth at bedtime. (Patient not taking: Reported on 01/14/2023), Disp: , Rfl:    hydrochlorothiazide (HYDRODIURIL) 12.5 MG tablet, Take 1 tablet (12.5 mg total) by mouth daily. (Patient not taking: Reported on 01/14/2023), Disp: 30 tablet, Rfl: 11   HYDROcodone-acetaminophen (NORCO/VICODIN) 5-325 MG per tablet, Take 1-2 tablets by mouth every 6 (six) hours as needed for moderate pain. 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    Renal calculus, bilateral    Urgency of urination    Visual distortion    both eyes- wavey appearance    PAST SURGICAL HISTORY: Past Surgical History:  Procedure Laterality Date   CYSTOSCOPY W/ URETERAL STENT REMOVAL Right 06/03/2013   Procedure: CYSTOSCOPY WITH STENT REMOVAL;  Surgeon: Kathi Ludwig, MD;  Location: [facility];  Service: Urology;  Laterality: Right;   CYSTOSCOPY WITH RETROGRADE PYELOGRAM, URETEROSCOPY AND STENT PLACEMENT Right 06/03/2013   Procedure: CYSTOSCOPY WITH RETROGRADE PYELOGRAM, URETEROSCOPY , BACKSTOP APPLICATION, STONE RETRIEVED;  Surgeon: Kathi Ludwig, MD;  Location: [facility];  Service: Urology;  Laterality: Right;  STONE IN SPECIMEN JAR AT 1000 DID TIME OUT   CYSTOSCOPY WITH STENT PLACEMENT  06/03/2013   Procedure: CYSTOSCOPY WITH STENT PLACEMENT;  Surgeon: Kathi Ludwig, MD;  Location: [facility];  Service: Urology;;   CYSTOSCOPY WITH URETEROSCOPY AND STENT PLACEMENT Bilateral 03/28/2014   Procedure: BILATERAL URETEROSCOPY/LASER LITHOTRIPSY ON RIGHT;  AND BIL STENT PLACEMENT;  Surgeon: Jerilee Field, MD;  Location: [facility];  Service: Urology;  Laterality: Bilateral;   CYSTOSCOPY/RETROGRADE/URETEROSCOPY/STONE EXTRACTION WITH BASKET Bilateral 04/29/2013   Procedure: CYSTOSCOPY/BILATERAL RETROGRADE LEFT URETEROSCOPY/STONE REMOVAL WITH HOLMIUM LASER AND DIGITAL URETEROSCOPE with BILATERAL STENT PLACEMENT   ;  Surgeon: Kathi Ludwig, MD;  Location: [facility];  Service: Urology;  Laterality: Bilateral;   EXTRACORPOREAL SHOCK WAVE LITHOTRIPSY  age 38   HOLMIUM LASER APPLICATION Right 06/03/2013   Procedure: HOLMIUM LASER APPLICATION;  Surgeon: Kathi Ludwig, MD;  Location: [facility];  Service: Urology;  Laterality: Right;   HYPOSPADIAS  CORRECTION  age 61   REFRACTIVE SURGERY Bilateral 05/2008    FAMILY HISTORY: Family History  Problem Relation Age of Onset   Rheum arthritis Mother    Thyroid disease Mother    Stroke Father    Prostate cancer Father    Kidney Stones Father    Diabetes Maternal Grandmother    Cancer Paternal Grandfather     SOCIAL HISTORY: Social History   Socioeconomic History   Marital status: Media planner    Spouse name: Not on file   Number of children: Not on file   Years of education: Not on file   Highest education level: Not on file  Occupational History    Comment: Works hotel administration  Tobacco Use   Smoking status: Former    Types: Cigars   Smokeless tobacco: Never   Tobacco comments:    rare cigar (one twice per year)  Vaping Use   Vaping status: Every Day  Substance and Sexual Activity   Alcohol use: Yes    Comment: rare   Drug use: No   Sexual activity: Not on file  Other Topics Concern   Not on file  Social History Narrative   Has domestic partner   Caffiene 1-2cup coffee daily   Works [employer] employees [employer]   Social Determinants of Corporate investment banker Strain: Not on Ship broker Insecurity: Not on file  Transportation Needs: Not on file  Physical Activity: Not on file  Stress: Not on file  Social Connections: Not on file  Intimate Partner Violence: Not on file       PHYSICAL EXAM  Vitals:   01/14/23 1249  BP: 108/71  Pulse: 70  Weight: 158 lb (71.7 kg)  Height: 5\' 8"  (1.727 m)    Body mass index is 24.02 kg/m.  OD 20/30-2 OS 20/30  General: The patient is well-developed and well-nourished and in no acute distress  HEENT:  Head is [place]/AT.  Sclera are anicteric.  Funduscopic exam shows normal optic discs and retinal vessels.  Neck: No carotid bruits are noted.  The neck is nontender.  Cardiovascular: The heart has a regular rate and rhythm with a normal S1 and S2. There were no murmurs, gallops or rubs.    Skin:  Extremities are without rash or  edema.  Musculoskeletal:  Back is nontender  Neurologic Exam  Mental status: The patient is alert and oriented x 3 at the time of the examination. The patient has apparent normal recent and remote memory, with an apparently normal attention span and concentration ability.   Speech is normal.  Cranial nerves: Extraocular movements appear to be full and no obvious dyconjugate.   He reported diplopia  in all gaze but separation increased looking down and tracking and improved looking up and tracking horizontally.  Pupils are equal, round, and reactive to light and accomodation.  No ptosis Facial symmetry is present. There is good facial sensation to soft touch bilaterally.Facial strength is normal.  Trapezius and sternocleidomastoid strength is normal. No dysarthria is noted.  The tongue is midline, and the patient has symmetric elevation of the soft palate. No obvious hearing deficits are noted.  Motor:  Muscle bulk is normal.   Tone is normal. Strength is  5 / 5 in all 4 extremities.   Sensory: Sensory testing is intact to pinprick, soft touch and vibration sensation in all 4 extremities.  Coordination: Cerebellar testing reveals good finger-nose-finger and heel-to-shin bilaterally.  Gait and station: Station is normal.   Gait is normal. Tandem gait is normal. Romberg is negative.   Reflexes: Deep tendon reflexes are symmetric and normal bilaterally.        DIAGNOSTIC DATA (LABS, IMAGING, TESTING) - I reviewed patient records, labs, notes, testing and imaging myself where available.  Lab Results  Component Value Date   WBC 6.9 04/10/2014   HGB 15.1 04/10/2014   HCT 44.0 04/10/2014   MCV 83.0 04/10/2014   PLT 315 04/10/2014      Component Value Date/Time   NA 139 04/10/2014 0900   K 3.6 (L) 04/10/2014 0900   CL 100 04/10/2014 0900   CO2 28 04/10/2014 0900   GLUCOSE 95 04/10/2014 0900   BUN 14 04/10/2014 0900   CREATININE 1.06 04/10/2014 0900    CALCIUM 9.9 04/10/2014 0900   GFRNONAA >90 04/10/2014 0900   GFRAA >90 04/10/2014 0900    Lab Results  Component Value Date   TSH 1.39 10/10/2011       ASSESSMENT AND PLAN  Diplopia  Chronic migraine w/o aura, not intractable, w/o stat migr   In summary, Mr. Beavin is a 42 year old man with a several month history of diplopia.  He also has a long history of chronic migraine.  The etiology of the diplopia is uncertain.  It is binocular and noted in all gazes.  Because it can be variable, we will check some blood work for myasthenia gravis.  Additionally, in case there was a cranial nerve disorder causing the issues, we will check labs for ischemic or autoimmune etiology.  Because his symptoms have been persistent, if an etiology is not determined, I will recommend that he see neuro-ophthalmology at one of the academic centers.  He will continue Aimovig, Nurtec and Maxalt for the chronic migraine.  He also sometimes takes butalbital if other medications have not been helpful.Marland Kitchen  He will return to see me and 4 months or sooner for new or worsening neurologic symptoms.  Thank you for asking me to see Mr. Virag.  Please let me know if I can be of further assistance with him or other patients in the future.  Oris Calmes A. Epimenio Foot, MD, [facility] 01/14/2023, 1:52 PM Certified in Neurology, Clinical Neurophysiology, Sleep Medicine and Neuroimaging  [facility] [address] [place], [place] [address] [phone]

## 2023-01-14 ENCOUNTER — Ambulatory Visit: Payer: BC Managed Care – PPO | Admitting: Neurology

## 2023-01-14 ENCOUNTER — Encounter: Payer: Self-pay | Admitting: Neurology

## 2023-01-14 VITALS — BP 108/71 | HR 70 | Ht 68.0 in | Wt 158.0 lb

## 2023-01-14 DIAGNOSIS — H532 Diplopia: Secondary | ICD-10-CM | POA: Diagnosis not present

## 2023-01-14 DIAGNOSIS — R799 Abnormal finding of blood chemistry, unspecified: Secondary | ICD-10-CM | POA: Diagnosis not present

## 2023-01-14 DIAGNOSIS — G43709 Chronic migraine without aura, not intractable, without status migrainosus: Secondary | ICD-10-CM | POA: Diagnosis not present

## 2023-01-14 DIAGNOSIS — R946 Abnormal results of thyroid function studies: Secondary | ICD-10-CM | POA: Diagnosis not present

## 2023-01-21 ENCOUNTER — Telehealth: Payer: Self-pay | Admitting: Neurology

## 2023-01-21 NOTE — Telephone Encounter (Signed)
Referral for ophthalmology fax and emailed to [facility]. Phone: [phone], Fax: [phone].

## 2023-01-22 ENCOUNTER — Encounter: Payer: Self-pay | Admitting: Neurology

## 2023-01-27 DIAGNOSIS — H532 Diplopia: Secondary | ICD-10-CM | POA: Diagnosis not present

## 2023-01-27 LAB — ANA+ENA+DNA/DS+SCL 70+SJOSSA/B
ANA Titer 1: NEGATIVE
ENA RNP Ab: <0.2 AI (ref 0.0–0.9)
ENA SM Ab Ser-aCnc: <0.2 AI (ref 0.0–0.9)
ENA SSA (RO) Ab: <0.2 AI (ref 0.0–0.9)
ENA SSB (LA) Ab: <0.2 AI (ref 0.0–0.9)
Scleroderma (Scl-70) (ENA) Antibody, IgG: <0.2 AI (ref 0.0–0.9)
dsDNA Ab: <1 [IU]/mL (ref 0–9)

## 2023-01-27 LAB — MUSK ANTIBODIES: MuSK Antibodies: <1 U/mL

## 2023-01-27 LAB — C-REACTIVE PROTEIN: CRP: <1 mg/L (ref 0–10)

## 2023-01-27 LAB — ACHR ABS WITH REFLEX TO MUSK: AChR Binding Ab, Serum: <0.03 nmol/L (ref 0.00–0.24)

## 2023-01-27 LAB — SEDIMENTATION RATE: Sed Rate: 2 mm/h (ref 0–15)

## 2023-01-27 LAB — TSH: TSH: 1.87 u[IU]/mL (ref 0.450–4.500)

## 2023-01-28 NOTE — Telephone Encounter (Signed)
Refaxed referral  to [facility] as Urgent as requested by patient

## 2023-02-16 DIAGNOSIS — H532 Diplopia: Secondary | ICD-10-CM | POA: Diagnosis not present

## 2023-02-17 DIAGNOSIS — N200 Calculus of kidney: Secondary | ICD-10-CM | POA: Diagnosis not present

## 2023-03-09 DIAGNOSIS — H5000 Unspecified esotropia: Secondary | ICD-10-CM | POA: Diagnosis not present

## 2023-03-09 DIAGNOSIS — H532 Diplopia: Secondary | ICD-10-CM | POA: Diagnosis not present

## 2023-03-23 ENCOUNTER — Ambulatory Visit: Payer: BC Managed Care – PPO | Admitting: Neurology

## 2023-03-31 DIAGNOSIS — Z03818 Encounter for observation for suspected exposure to other biological agents ruled out: Secondary | ICD-10-CM | POA: Diagnosis not present

## 2023-03-31 DIAGNOSIS — R319 Hematuria, unspecified: Secondary | ICD-10-CM | POA: Diagnosis not present

## 2023-03-31 DIAGNOSIS — J069 Acute upper respiratory infection, unspecified: Secondary | ICD-10-CM | POA: Diagnosis not present

## 2023-03-31 DIAGNOSIS — R109 Unspecified abdominal pain: Secondary | ICD-10-CM | POA: Diagnosis not present

## 2023-04-06 DIAGNOSIS — N200 Calculus of kidney: Secondary | ICD-10-CM | POA: Diagnosis not present

## 2023-05-25 ENCOUNTER — Telehealth: Payer: Self-pay | Admitting: Neurology

## 2023-05-25 ENCOUNTER — Encounter: Payer: Self-pay | Admitting: Neurology

## 2023-05-25 ENCOUNTER — Telehealth: Payer: BC Managed Care – PPO | Admitting: Neurology

## 2023-05-25 DIAGNOSIS — H532 Diplopia: Secondary | ICD-10-CM

## 2023-05-25 DIAGNOSIS — G43709 Chronic migraine without aura, not intractable, without status migrainosus: Secondary | ICD-10-CM

## 2023-05-25 MED ORDER — BUTALBITAL-ACETAMINOPHEN 50-325 MG PO TABS: ORAL_TABLET | ORAL | 3 refills | Status: AC

## 2023-05-25 MED ORDER — AIMOVIG 140 MG/ML ~~LOC~~ SOAJ: 140.0000 mg | SUBCUTANEOUS | 4 refills | Status: DC

## 2023-05-25 NOTE — Telephone Encounter (Signed)
 ..  Pt understands that although there may be some limitations with this type of visit, we will take all precautions to reduce any security or privacy concerns.  Pt understands that this will be treated like an in office visit and we will file with pt's insurance, and there may be a patient responsible charge related to this service. ? ?

## 2023-05-25 NOTE — Progress Notes (Signed)
 [facility]  PATIENT: Raymond Chandler DOB: Dec 05, 1980  REFERRING DOCTOR OR PCP: Rockey Caster, DO SOURCE: Patient, notes from neurology, notes from primary care, imaging and lab reports, MRI images personally reviewed.  _________________________________   HISTORICAL  CHIEF COMPLAINT:  Chief Complaint  Patient presents with   Diplopia   Migraine   Virtual Visit via Video Note I connected with Raymond Chandler on 05/25/23 at  2:30 PM EST by a video enabled telemedicine application and verified that I am speaking with the correct person.  I discussed the limitations of evaluation and management by telemedicine and the availability of in person appointments. The patient expressed understanding and agreed to proceed.  Patient was at home; provider was at the office  HISTORY OF PRESENT ILLNESS:  Raymond Chandler is a 43 y.o. woman with diplopia and migraines.  Update 05/25/2023 Raymond Chandler had the sudden onset of diplopia starting in May 2024.  Raymond Chandler was advised to stop gabapentin.   Stopping that was tolerated but did not help the diplopia.   Raymond Chandler also stopped or reduced other medications.   The diplopia is constant.  Raymond Chandler has seen Neuro-opth and now has prism  glasses which help a little bit.  Repeat MG panel in the spring has been recommended (around March).    The diplopia is present in all gaze.  The diplopia is often worse as the day goes on.        Raymond Chandler denies numbness or weakness in the face.   No change in voice.   His eyes are sometimes dry, especially when Raymond Chandler wakes up (has had x many years).   Raymond Chandler sometimes feels off balanced, especially on stairs.  No falls.     Raymond Chandler is a 43 yo man who has had migraine headaches since age 5.    Raymond Chandler waa followed by [facility] and is on Aimovig , Nurtec and Fioricet.    Raymond Chandler would have some ocular migraine and some visual disturbances that were more chronic.   Raymond Chandler had seen Dr. Gladis at [facility] for these issues and other ophthalmologist and never had  an etiology.  Now sees Dr. Laurence.   Chronic migraines are better on Aimovig  with Nurtec/Maxalt for breakthrough  Raymond Chandler was premature weighing < 3 pounds and requiring an incubator x 3 months.  Raymond Chandler had neonatal jaundice and needed bilirubin lights.    Raymond Chandler denies any head injuries.  Raymond Chandler never had meningitis.   Raymond Chandler did have shingles x 3 , once in the left side of face and neck, never in eye.      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   Raymond Chandler feels less safe driving, especially later in the day.      Examination Raymond Chandler is a well-developed well-nourished man in no acute distress.  The head is normocephalic and atraumatic.  Sclera are anicteric.  Visible skin appears normal.  The neck has a good range of motion.   Raymond Chandler is alert and fully oriented with fluent speech and good attention, knowledge and memory.    Extraocular muscles are intact.  There was no fatiguing of the extraocular muscles with extended upgaze.  The palpebral fissure on the right is mildly reduced relative to the left but this did not worsen further after extended upgaze.  Facial strength seemed normal.  Palatal elevation and tongue protrusion are midline.  Raymond Chandler appears to have normal strength in the arms.  Rapid alternating  movements and finger-nose-finger are performed well.   Imaging: MRI of the brain 12/08/2022, MRI of the brain 12/18/2012 and MRI of the brain 08/30/2008 all show a T2/FLAIR hyperintense focus near the trigone of the right lateral ventricle.  This is nonspecific.  The brain was normal.  MRI angiogram 03/18/2014 showed some variant anatomy with a dominant left A1 segment left vertebral artery.  The left vertebral artery derives flow from both the anterior and posterior circulations left posterior cerebral artery derives flow from both the anterior and posterior circulations.  Some irregularity of right M2 segments, unclear if pathologic or artifact.  REVIEW OF  SYSTEMS: Constitutional: No fevers, chills, sweats, or change in appetite Eyes: No visual changes.  Raymond Chandler notes diplopia. Ear, nose and throat: No hearing loss, ear pain, nasal congestion, sore throat Cardiovascular: No chest pain, palpitations Respiratory:  No shortness of breath at rest or with exertion.   No wheezes GastrointestinaI: No nausea, vomiting, diarrhea, abdominal pain, fecal incontinence.  Raymond Chandler has GERD. Genitourinary:  No dysuria, urinary retention or frequency.  No nocturia.  Raymond Chandler has a history of kidney stones. Musculoskeletal:  No neck pain, back pain Integumentary: No rash, pruritus, skin lesions Neurological: as above Psychiatric: No depression at this time.  No anxiety Endocrine: No palpitations, diaphoresis, change in appetite, change in weigh or increased thirst Hematologic/Lymphatic:  No anemia, purpura, petechiae. Allergic/Immunologic: No itchy/runny eyes, nasal congestion, recent allergic reactions, rashes  ALLERGIES: Allergies  Allergen Reactions   Pyridium  [Phenazopyridine  Hcl] Other (See Comments)    METALIC TASTE IN MOUTH     HOME MEDICATIONS:  Current Outpatient Medications:    ACETAMINOPHEN -BUTALBITAL  50-325 MG TABS, One po every day prn migraine, Disp: 30 tablet, Rfl: 3   allopurinol  (ZYLOPRIM ) 100 MG tablet, Take 1 tablet (100 mg total) by mouth daily. (Patient taking differently: Take 100 mg by mouth every morning.), Disp: 30 tablet, Rfl: 11   Erenumab -aooe (AIMOVIG ) 140 MG/ML SOAJ, Inject 140 mg into the skin every 30 (thirty) days., Disp: 3 mL, Rfl: 4   ibuprofen (ADVIL,MOTRIN) 200 MG tablet, Take 200-400 mg by mouth every 6 (six) hours as needed for moderate pain., Disp: , Rfl:    MELATONIN PO, Take 30 mg by mouth at bedtime. Takes Sunday thru Thursday, Disp: , Rfl:    RABEprazole (ACIPHEX) 20 MG tablet, Take 20 mg by mouth in the morning and at bedtime., Disp: , Rfl:    Rimegepant Sulfate (NURTEC) 75 MG TBDP, Take 75 mg by mouth as needed., Disp: ,  Rfl:    rizatriptan (MAXALT) 10 MG tablet, Take 10 mg by mouth as needed for migraine. 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    Renal calculus, bilateral    Urgency of urination    Visual distortion    both eyes- wavey appearance    PAST SURGICAL HISTORY: Past Surgical History:  Procedure Laterality Date   CYSTOSCOPY W/ URETERAL STENT REMOVAL Right 06/03/2013   Procedure: CYSTOSCOPY WITH STENT REMOVAL;  Surgeon: Arlena LILLETTE Gal,  MD;  Location: [facility];  Service: Urology;  Laterality: Right;   CYSTOSCOPY WITH RETROGRADE PYELOGRAM, URETEROSCOPY AND STENT PLACEMENT Right 06/03/2013   Procedure: CYSTOSCOPY WITH RETROGRADE PYELOGRAM, URETEROSCOPY , BACKSTOP APPLICATION, STONE RETRIEVED;  Surgeon: Arlena LILLETTE Gal, MD;  Location: [facility];  Service: Urology;  Laterality: Right;  STONE IN SPECIMEN JAR AT 1000 DID TIME OUT   CYSTOSCOPY WITH STENT PLACEMENT  06/03/2013   Procedure: CYSTOSCOPY WITH STENT PLACEMENT;  Surgeon: Arlena LILLETTE Gal, MD;  Location: [facility];  Service: Urology;;   CYSTOSCOPY WITH URETEROSCOPY AND STENT PLACEMENT Bilateral 03/28/2014   Procedure: BILATERAL URETEROSCOPY/LASER LITHOTRIPSY ON RIGHT;  AND BIL STENT PLACEMENT;  Surgeon: Donnice Brooks, MD;  Location: [facility];  Service: Urology;  Laterality: Bilateral;   CYSTOSCOPY/RETROGRADE/URETEROSCOPY/STONE EXTRACTION WITH BASKET Bilateral 04/29/2013   Procedure: CYSTOSCOPY/BILATERAL RETROGRADE LEFT URETEROSCOPY/STONE  REMOVAL WITH HOLMIUM LASER AND DIGITAL URETEROSCOPE with BILATERAL STENT PLACEMENT   ;  Surgeon: Arlena LILLETTE Gal, MD;  Location: [facility];  Service: Urology;  Laterality: Bilateral;   EXTRACORPOREAL SHOCK WAVE LITHOTRIPSY  age 25   HOLMIUM LASER APPLICATION Right 06/03/2013   Procedure: HOLMIUM LASER APPLICATION;  Surgeon: Arlena LILLETTE Gal, MD;  Location: [facility];  Service: Urology;  Laterality: Right;   HYPOSPADIAS CORRECTION  age 89   REFRACTIVE SURGERY Bilateral 05/2008    FAMILY HISTORY: Family History  Problem Relation Age of Onset   Rheum arthritis Mother    Thyroid  disease Mother    Stroke Father    Prostate cancer Father    Kidney Stones Father    Diabetes Maternal Grandmother    Cancer Paternal Grandfather     SOCIAL HISTORY: Social History   Socioeconomic History   Marital status: Media Planner    Spouse name: Not on file   Number of children: Not on file   Years of education: Not on file   Highest education level: Not on file  Occupational History    Comment: Works hotel administration  Tobacco Use   Smoking status: Former    Types: Cigars   Smokeless tobacco: Never   Tobacco comments:    rare cigar (one twice per year)  Vaping Use   Vaping status: Every Day  Substance and Sexual Activity   Alcohol use: Yes    Comment: rare   Drug use: No   Sexual activity: Not on file  Other Topics Concern   Not on file  Social History Narrative   Has domestic partner   Caffiene 1-2cup coffee daily   Works state employees [employer]   Social Drivers of Corporate Investment Banker Strain: Not on file  Food Insecurity: Not on file  Transportation Needs: Not on file  Physical Activity: Not on file  Stress: Not on file  Social Connections: Not on file  Intimate Partner Violence: Not on file       PHYSICAL EXAM from 01/14/2023  There were no vitals filed for this visit.   There is no height or weight on file to  calculate BMI.  OD 20/30-2 OS 20/30  General: The patient is well-developed and well-nourished and in no acute distress  HEENT:  Head is [place]/AT.  Sclera are anicteric.  Funduscopic exam shows normal optic discs and retinal vessels.  Neck: No carotid bruits are noted.  The neck is nontender.  Cardiovascular: The heart has a regular rate and rhythm with a normal S1 and S2. There were no  murmurs, gallops or rubs.    Skin: Extremities are without rash or  edema.  Musculoskeletal:  Back is nontender  Neurologic Exam  Mental status: The patient is alert and oriented x 3 at the time of the examination. The patient has apparent normal recent and remote memory, with an apparently normal attention span and concentration ability.   Speech is normal.  Cranial nerves: Extraocular movements appear to be full and no obvious dyconjugate.   Raymond Chandler reported diplopia in all gaze but separation increased looking down and tracking and improved looking up and tracking horizontally.  Pupils are equal, round, and reactive to light and accomodation.  No ptosis Facial symmetry is present. There is good facial sensation to soft touch bilaterally.Facial strength is normal.  Trapezius and sternocleidomastoid strength is normal. No dysarthria is noted.  The tongue is midline, and the patient has symmetric elevation of the soft palate. No obvious hearing deficits are noted.  Motor:  Muscle bulk is normal.   Tone is normal. Strength is  5 / 5 in all 4 extremities.   Sensory: Sensory testing is intact to pinprick, soft touch and vibration sensation in all 4 extremities.  Coordination: Cerebellar testing reveals good finger-nose-finger and heel-to-shin bilaterally.  Gait and station: Station is normal.   Gait is normal. Tandem gait is normal. Romberg is negative.   Reflexes: Deep tendon reflexes are symmetric and normal bilaterally.        DIAGNOSTIC DATA (LABS, IMAGING, TESTING) - I reviewed patient records, labs,  notes, testing and imaging myself where available.  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  In a couple months Raymond Chandler will recheck the acetylcholine receptor antibodies.  We discussed that even if this is negative we may want to do a trial of pyridostigmine if symptoms do not improve.  If Raymond Chandler does have ocular myasthenia, the blood work is negative at least 20% of the time.  We could also consider ordering a single-fiber EMG if symptoms worsen. Use prism  glasses as needed. For headaches Raymond Chandler will continue Aimovig  and take Fioricet, Maxalt or Nurtec as needed 4.  Return in 6 months or sooner if there are new or worsening neurologic symptoms.   Follow Up Instructions: I discussed the assessment and treatment plan with the patient. The patient was provided an opportunity to ask questions and all were answered. The patient agreed with the plan and demonstrated an understanding of the instructions.    The patient was advised to call back or seek an in-person evaluation if the symptoms worsen or if the condition fails to improve as anticipated.  I provided 25 minutes of non-face-to-face time during this encounter.  Waylynn Benefiel A. Vear, MD, Teola RENO 05/25/2023, 5:08 PM Certified  in Neurology, Clinical Neurophysiology, Sleep Medicine and Neuroimaging  [facility] [address], [address] [place], [place] [address] [phone]

## 2023-06-01 ENCOUNTER — Telehealth: Payer: Self-pay | Admitting: Neurology

## 2023-06-01 NOTE — Telephone Encounter (Signed)
Pt called stating that his  Erenumab-aooe (AIMOVIG) 140 MG/ML SOAJ is needing a PA Pt would like RN to start the process of the PA. Please advise.

## 2023-06-02 ENCOUNTER — Telehealth: Payer: Self-pay

## 2023-06-02 ENCOUNTER — Other Ambulatory Visit (HOSPITAL_COMMUNITY): Payer: Self-pay

## 2023-06-02 NOTE — Telephone Encounter (Signed)
Pharmacy Patient Advocate Encounter   Received notification from Physician's Office that prior authorization for Aimovig 140MG /ML auto-injectors is required/requested.   Insurance verification completed.   The patient is insured through [facility] .   Per test claim: PA required; PA submitted to above mentioned insurance via CoverMyMeds Key/confirmation #/EOC [id number] Status is pending

## 2023-06-03 ENCOUNTER — Other Ambulatory Visit (HOSPITAL_COMMUNITY): Payer: Self-pay

## 2023-06-03 NOTE — Telephone Encounter (Signed)
Pharmacy Patient Advocate Encounter  Received notification from [facility] that Prior Authorization for Aimovig 140MG /ML auto-injectors has been APPROVED from 06/02/23 to 06/01/24. Ran test claim, Copay is $30.00. This test claim was processed through [facility]- copay amounts may vary at other pharmacies due to pharmacy/plan contracts, or as the patient moves through the different stages of their insurance plan.   PA #/Case ID/Reference #: PA Case ID #: [id number]

## 2023-06-03 NOTE — Telephone Encounter (Signed)
Called and spoke w/ pt. He just wanted to confirm PA approved via insurance and now he is to pick up from pharmacy. I confirmed. He will f/u with pharmacy.

## 2023-06-03 NOTE — Telephone Encounter (Addendum)
Pt LVM at 10:26 amJust missed a call from [facility] giving me call concerning PA and to call if have  any questions. Just have one follow up question, so if Kara Mead or some one else concerning my question with the PA. Look forward to a call back.

## 2023-06-03 NOTE — Telephone Encounter (Signed)
Called and LVM for pt letting him know PA approved.

## 2023-06-22 DIAGNOSIS — F5104 Psychophysiologic insomnia: Secondary | ICD-10-CM | POA: Diagnosis not present

## 2023-06-22 DIAGNOSIS — K219 Gastro-esophageal reflux disease without esophagitis: Secondary | ICD-10-CM | POA: Diagnosis not present

## 2023-06-22 DIAGNOSIS — G43009 Migraine without aura, not intractable, without status migrainosus: Secondary | ICD-10-CM | POA: Diagnosis not present

## 2023-06-22 DIAGNOSIS — H532 Diplopia: Secondary | ICD-10-CM | POA: Diagnosis not present

## 2023-07-21 DIAGNOSIS — H532 Diplopia: Secondary | ICD-10-CM | POA: Diagnosis not present

## 2023-07-27 ENCOUNTER — Telehealth: Payer: Self-pay | Admitting: Neurology

## 2023-07-27 MED ORDER — NURTEC 75 MG PO TBDP: 75.0000 mg | ORAL_TABLET | ORAL | 0 refills | Status: DC | PRN

## 2023-07-27 NOTE — Telephone Encounter (Signed)
 Pt last seen 05/25/23 by Dr. Epimenio Foot for Diplopia/migraine. Next f/u 12/08/23.   Note from 05/25/23 states: " Now sees Dr. Imogene Burn. Chronic migraines are better on Aimovig with Nurtec/Maxalt for breakthrough"  Under assessment and plan MD states: "For headaches he will continue Aimovig and take Fioricet, Maxalt or Nurtec as needed"

## 2023-07-27 NOTE — Telephone Encounter (Signed)
 Pt called stating that the NP he was seeing as a PCP has left the practice and he is wanting to know if Provider here would fill the Nurtec for him. Please advise.

## 2023-07-27 NOTE — Addendum Note (Signed)
 Addended by: Eather Colas E on: 07/27/2023 11:24 AM   Modules accepted: Orders

## 2023-08-05 DIAGNOSIS — H518 Other specified disorders of binocular movement: Secondary | ICD-10-CM | POA: Diagnosis not present

## 2023-08-05 DIAGNOSIS — H532 Diplopia: Secondary | ICD-10-CM | POA: Diagnosis not present

## 2023-08-07 ENCOUNTER — Encounter: Payer: Self-pay | Admitting: Neurology

## 2023-08-07 DIAGNOSIS — G43709 Chronic migraine without aura, not intractable, without status migrainosus: Secondary | ICD-10-CM

## 2023-08-10 ENCOUNTER — Other Ambulatory Visit (HOSPITAL_COMMUNITY): Payer: Self-pay

## 2023-08-10 ENCOUNTER — Telehealth: Payer: Self-pay

## 2023-08-10 MED ORDER — NURTEC 75 MG PO TBDP: 75.0000 mg | ORAL_TABLET | ORAL | 5 refills | Status: DC | PRN

## 2023-08-10 NOTE — Telephone Encounter (Signed)
 Pharmacy Patient Advocate Encounter   Received notification from Patient Advice Request messages that prior authorization for Nurtec 75MG  dispersible tablets is required/requested.   Insurance verification completed.   The patient is insured through [facility] .   Per test claim: PA required; PA submitted to above mentioned insurance via CoverMyMeds Key/confirmation #/EOC [id number] Status is pending

## 2023-08-10 NOTE — Telephone Encounter (Signed)
 Spoke to patient doesn't have any nurtec tablets . Pt states out of nurtec for two weeks. Sent new order for 8 tablets and will send to PA team for prior authorization. Pt expressed understanding and thanked me for calling

## 2023-08-10 NOTE — Telephone Encounter (Signed)
 Noted.

## 2023-08-12 ENCOUNTER — Other Ambulatory Visit (HOSPITAL_COMMUNITY): Payer: Self-pay

## 2023-08-12 NOTE — Telephone Encounter (Signed)
 Received a faxed form requesting additional information-completed form along with clinicals faxed to [facility] at [phone]

## 2023-08-13 ENCOUNTER — Other Ambulatory Visit (HOSPITAL_COMMUNITY): Payer: Self-pay

## 2023-08-13 NOTE — Telephone Encounter (Signed)
 Pt informed via [employer].

## 2023-08-13 NOTE — Telephone Encounter (Signed)
 Pharmacy Patient Advocate Encounter  Received notification from [facility] that Prior Authorization for Nurtec 75MG  dispersible tablets has been APPROVED from 08/11/2023 to 08/09/2024. Unable to obtain price due to refill too soon rejection, last fill date 08/11/2023 next available fill date4/24/2025   PA #/Case ID/Reference #: PA Case ID #: [id number]

## 2023-08-15 ENCOUNTER — Telehealth: Payer: Self-pay | Admitting: Neurology

## 2023-08-15 NOTE — Telephone Encounter (Signed)
 He recently saw Dr. Norton Blizzard for his diplopia.  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  Optic nerves had normal diameter.  She wonders about the possibility of IIH and whether a Diamox trial or lumbar puncture should be considered.   I sent her this secure email  Dr. Imogene Burn, I reviewed your notes from 08/05/2023 on Raymond Chandler (date of birth [birth year]).  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  Optic nerve do not show widening nerve sheaths and there is a normal amount of negligible CSF around the nerves.  I also compared to his 2014 MRI.  The enlarged Meckel's cave's can be seen on that scan as well.  The sella turcica is the same size though the pituitary gland has a mildly reduced height on the current MRI compared to the 2014 MRI.  We could certainly do a trial of Diamox.  Do you want to call this in or would you prefer [place] to?   If he cannot tolerate, we could check a lumbar puncture (or switch to methazolamide).

## 2023-09-10 DIAGNOSIS — R110 Nausea: Secondary | ICD-10-CM | POA: Diagnosis not present

## 2023-09-10 DIAGNOSIS — R1084 Generalized abdominal pain: Secondary | ICD-10-CM | POA: Diagnosis not present

## 2023-09-10 DIAGNOSIS — K5909 Other constipation: Secondary | ICD-10-CM | POA: Diagnosis not present

## 2023-10-01 DIAGNOSIS — J101 Influenza due to other identified influenza virus with other respiratory manifestations: Secondary | ICD-10-CM | POA: Diagnosis not present

## 2023-10-01 DIAGNOSIS — Z03818 Encounter for observation for suspected exposure to other biological agents ruled out: Secondary | ICD-10-CM | POA: Diagnosis not present

## 2023-10-01 DIAGNOSIS — R051 Acute cough: Secondary | ICD-10-CM | POA: Diagnosis not present

## 2023-10-01 DIAGNOSIS — R509 Fever, unspecified: Secondary | ICD-10-CM | POA: Diagnosis not present

## 2023-12-08 ENCOUNTER — Telehealth: Payer: Self-pay | Admitting: Neurology

## 2023-12-08 ENCOUNTER — Encounter: Payer: Self-pay | Admitting: Neurology

## 2023-12-08 ENCOUNTER — Telehealth: Payer: BC Managed Care – PPO | Admitting: Neurology

## 2023-12-08 DIAGNOSIS — H532 Diplopia: Secondary | ICD-10-CM

## 2023-12-08 DIAGNOSIS — G43709 Chronic migraine without aura, not intractable, without status migrainosus: Secondary | ICD-10-CM

## 2023-12-08 DIAGNOSIS — E236 Other disorders of pituitary gland: Secondary | ICD-10-CM

## 2023-12-08 MED ORDER — NURTEC 75 MG PO TBDP: 75.0000 mg | ORAL_TABLET | ORAL | 11 refills | Status: AC | PRN

## 2023-12-08 MED ORDER — AIMOVIG 140 MG/ML ~~LOC~~ SOAJ: 140.0000 mg | SUBCUTANEOUS | 4 refills | Status: AC

## 2023-12-08 NOTE — Telephone Encounter (Signed)
 LVM and sent MyChart msg asking pt to cb and schedule appt for 7 months w Dr. Vear.

## 2023-12-08 NOTE — Telephone Encounter (Signed)
-----   Message from Nurse Maurilio PARAS sent at 12/08/2023  9:19 AM EDT ----- Please contact pt to schedule 7 month f/u. Had VV today with Dr. Vear. Thank you!  Raymond Chandler DOB [birth year] ----- Message ----- From: Vear Charlie LABOR, MD Sent: 12/08/2023   9:14 AM EDT To: [facility] 1 Calls  F/u in 7 monthd

## 2023-12-08 NOTE — Progress Notes (Signed)
 [facility]  PATIENT: Raymond Chandler DOB: [birth year]  REFERRING DOCTOR OR PCP: Rockey Caster, DO SOURCE: Patient, notes from neurology, notes from primary care, imaging and lab reports, MRI images personally reviewed.  _________________________________   HISTORICAL  CHIEF COMPLAINT:  No chief complaint on file.  Virtual Visit via Video Note I connected with Raymond Chandler on 12/08/23 at  8:30 AM EDT by a video enabled telemedicine application and verified that I am speaking with the correct person.  I discussed the limitations of evaluation and management by telemedicine and the availability of in person appointments. The patient expressed understanding and agreed to proceed.  Patient was at home; provider was at the office  HISTORY OF PRESENT ILLNESS:  Raymond Chandler is a 43 y.o. woman with diplopia and migraines.  Update 12/08/2023 DIPLOPIA: Diplopia is stable.    He switches back and forth between prism  glasses and regular glasses.  He had the sudden onset of diplopia starting in May 2024.  He had no benefit from stopping gabapentin and other medications.   The diplopia is constant.  He has seen Neuro-opth x 2 and now has prism  glasses which help a little bit.  Repeat MG panel in the spring has been recommended (around March).    The diplopia is present in all gaze.  The diplopia is often worse as the day goes on and neuro-opth felt was due to eye strain.     No signs of MG.  He denies numbness or weakness in the face.   No change in voice.   His eyes are sometimes dry, especially when he wakes up (has had x many years).   He sometimes feels off balanced, especially on stairs.  No falls.     MIGRAINE:   He has had migraine headaches since age 43.    He waa followed by [facility] and is on Aimovig , Nurtec and Fioricet - rarely rizatriptan.    He would have some ocular migraine and some visual disturbances that were more chronic.   He had seen Dr. Gladis at  [facility] for these issues and other ophthalmologist and never had an etiology.  Now sees Dr. Laurence.   Chronic migraines are better on Aimovig  with Nurtec/Maxalt for breakthrough  Other:   He was premature weighing < 3 pounds and requiring an incubator x 3 months.  He had neonatal jaundice and needed bilirubin lights.    He denies any head injuries.  He never had meningitis.   He did have shingles x 3 , once in the left side of face and neck, never in eye.      Examination He is a well-developed well-nourished man in no acute distress.  The head is normocephalic and atraumatic.  Sclera are anicteric.  Visible skin appears normal.  The neck has a good range of motion.   He is alert and fully oriented with fluent speech and good attention, knowledge and memory.    Extraocular muscles appear to be intact.    The palpebral fissure on the right is mildly reduced relative to the left but this did not worsen further after extended upgaze.  Facial strength seemed normal.  Palatal elevation and tongue protrusion are midline.  He appears to have normal strength in the arms.  Rapid alternating movements and finger-nose-finger are performed well.   Imaging: MRI of the brain 12/08/2022, MRI of the brain 12/18/2012 and MRI of the brain 08/30/2008 all show a T2/FLAIR hyperintense focus near the  trigone of the right lateral ventricle.  This is nonspecific.  The brain was normal.  MRI angiogram 03/18/2014 showed some variant anatomy with a dominant left A1 segment left vertebral artery.  The left vertebral artery derives flow from both the anterior and posterior circulations left posterior cerebral artery derives flow from both the anterior and posterior circulations.  Some irregularity of right M2 segments, unclear if pathologic or artifact.  REVIEW OF SYSTEMS: Constitutional: No fevers, chills, sweats, or change in appetite Eyes: No visual changes.  He notes diplopia. Ear, nose and throat: No hearing loss, ear pain, nasal  congestion, sore throat Cardiovascular: No chest pain, palpitations Respiratory:  No shortness of breath at rest or with exertion.   No wheezes GastrointestinaI: No nausea, vomiting, diarrhea, abdominal pain, fecal incontinence.  He has GERD. Genitourinary:  No dysuria, urinary retention or frequency.  No nocturia.  He has a history of kidney stones. Musculoskeletal:  No neck pain, back pain Integumentary: No rash, pruritus, skin lesions Neurological: as above Psychiatric: No depression at this time.  No anxiety Endocrine: No palpitations, diaphoresis, change in appetite, change in weigh or increased thirst Hematologic/Lymphatic:  No anemia, purpura, petechiae. Allergic/Immunologic: No itchy/runny eyes, nasal congestion, recent allergic reactions, rashes  ALLERGIES: Allergies  Allergen Reactions   Pyridium  [Phenazopyridine  Hcl] Other (See Comments)    METALIC TASTE IN MOUTH     HOME MEDICATIONS:  Current Outpatient Medications:    ACETAMINOPHEN -BUTALBITAL  50-325 MG TABS, One po every day prn migraine, Disp: 30 tablet, Rfl: 3   allopurinol  (ZYLOPRIM ) 100 MG tablet, Take 1 tablet (100 mg total) by mouth daily. (Patient taking differently: Take 100 mg by mouth every morning.), Disp: 30 tablet, Rfl: 11   Erenumab -aooe (AIMOVIG ) 140 MG/ML SOAJ, Inject 140 mg into the skin every 30 (thirty) days., Disp: 3 mL, Rfl: 4   ibuprofen (ADVIL,MOTRIN) 200 MG tablet, Take 200-400 mg by mouth every 6 (six) hours as needed for moderate pain., Disp: , Rfl:    MELATONIN PO, Take 30 mg by mouth at bedtime. Takes Sunday thru Thursday, Disp: , Rfl:    RABEprazole (ACIPHEX) 20 MG tablet, Take 20 mg by mouth in the morning and at bedtime., Disp: , Rfl:    Rimegepant Sulfate (NURTEC) 75 MG TBDP, Take 1 tablet (75 mg total) by mouth as needed., Disp: 6 tablet, Rfl: 11   rizatriptan (MAXALT) 10 MG tablet, Take 10 mg by mouth as needed for migraine. 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    Renal calculus, bilateral    Urgency of urination    Visual distortion    both eyes- wavey appearance    PAST SURGICAL HISTORY: Past Surgical History:  Procedure Laterality Date   CYSTOSCOPY W/ URETERAL STENT REMOVAL Right 06/03/2013   Procedure: CYSTOSCOPY WITH STENT REMOVAL;  Surgeon: Arlena LILLETTE Gal, MD;  Location: [facility];  Service: Urology;  Laterality: Right;   CYSTOSCOPY WITH RETROGRADE PYELOGRAM, URETEROSCOPY AND STENT PLACEMENT Right 06/03/2013   Procedure: CYSTOSCOPY  WITH RETROGRADE PYELOGRAM, URETEROSCOPY , BACKSTOP APPLICATION, STONE RETRIEVED;  Surgeon: Arlena LILLETTE Gal, MD;  Location: [facility];  Service: Urology;  Laterality: Right;  STONE IN SPECIMEN JAR AT 1000 DID TIME OUT   CYSTOSCOPY WITH STENT PLACEMENT  06/03/2013   Procedure: CYSTOSCOPY WITH STENT PLACEMENT;  Surgeon: Arlena LILLETTE Gal, MD;  Location: [facility];  Service: Urology;;   CYSTOSCOPY WITH URETEROSCOPY AND STENT PLACEMENT Bilateral 03/28/2014   Procedure: BILATERAL URETEROSCOPY/LASER LITHOTRIPSY ON RIGHT;  AND BIL STENT PLACEMENT;  Surgeon: Donnice Brooks, MD;  Location: [facility];  Service: Urology;  Laterality: Bilateral;   CYSTOSCOPY/RETROGRADE/URETEROSCOPY/STONE EXTRACTION WITH BASKET Bilateral 04/29/2013   Procedure: CYSTOSCOPY/BILATERAL RETROGRADE LEFT URETEROSCOPY/STONE REMOVAL WITH HOLMIUM LASER AND DIGITAL URETEROSCOPE with BILATERAL STENT PLACEMENT   ;  Surgeon: Arlena LILLETTE Gal, MD;  Location: [facility];  Service: Urology;  Laterality: Bilateral;   EXTRACORPOREAL SHOCK WAVE LITHOTRIPSY  age 47   HOLMIUM LASER APPLICATION Right 06/03/2013   Procedure: HOLMIUM LASER APPLICATION;  Surgeon: Arlena LILLETTE Gal, MD;  Location: [facility];  Service: Urology;  Laterality: Right;   HYPOSPADIAS CORRECTION  age 20   REFRACTIVE SURGERY Bilateral 05/2008    FAMILY HISTORY: Family History  Problem Relation Age of Onset   Rheum arthritis Mother    Thyroid  disease Mother    Stroke Father    Prostate cancer Father    Kidney Stones Father    Diabetes Maternal Grandmother    Cancer Paternal Grandfather     SOCIAL HISTORY: Social History   Socioeconomic History   Marital status: Media planner    Spouse name: Not on file   Number of children: Not on file   Years of education: Not on file   Highest education level: Not on file  Occupational History    Comment: Works hotel administration  Tobacco Use   Smoking status: Former    Types: Cigars   Smokeless tobacco: Never   Tobacco comments:    rare cigar (one twice per year)  Vaping Use   Vaping status: Every Day  Substance and Sexual Activity   Alcohol use: Yes    Comment: rare   Drug use: No   Sexual activity: Not on file  Other Topics Concern   Not on file  Social History Narrative   Has domestic partner   Caffiene 1-2cup coffee daily   Works [employer]   Social Drivers of Corporate investment banker Strain: Not on file  Food Insecurity: Not on file  Transportation Needs: Not on file  Physical Activity: Not on file  Stress: Not on file  Social Connections: Not on file  Intimate Partner Violence: Not on file       PHYSICAL EXAM from 01/14/2023  There were no vitals filed for this visit.   There is no height or weight on file to calculate BMI.  OD 20/30-2 OS 20/30  General: The patient is well-developed and well-nourished and in no acute distress  HEENT:  Head is [place]/AT.   Sclera are anicteric.  Funduscopic exam shows normal optic discs and retinal vessels.  Neck: No carotid bruits are noted.  The neck is nontender.  Cardiovascular: The heart has a regular rate and rhythm with a normal S1 and S2. There were no murmurs, gallops or rubs.    Skin: Extremities are without rash or  edema.  Musculoskeletal:  Back is nontender  Neurologic Exam  Mental status: The patient  is alert and oriented x 3 at the time of the examination. The patient has apparent normal recent and remote memory, with an apparently normal attention span and concentration ability.   Speech is normal.  Cranial nerves: Extraocular movements appear to be full and no obvious dyconjugate.   He reported diplopia in all gaze but separation increased looking down and tracking and improved looking up and tracking horizontally.  Pupils are equal, round, and reactive to light and accomodation.  No ptosis Facial symmetry is present. There is good facial sensation to soft touch bilaterally.Facial strength is normal.  Trapezius and sternocleidomastoid strength is normal. No dysarthria is noted.  The tongue is midline, and the patient has symmetric elevation of the soft palate. No obvious hearing deficits are noted.  Motor:  Muscle bulk is normal.   Tone is normal. Strength is  5 / 5 in all 4 extremities.   Sensory: Sensory testing is intact to pinprick, soft touch and vibration sensation in all 4 extremities.  Coordination: Cerebellar testing reveals good finger-nose-finger and heel-to-shin bilaterally.  Gait and station: Station is normal.   Gait is normal. Tandem gait is normal. Romberg is negative.   Reflexes: Deep tendon reflexes are symmetric and normal bilaterally.        DIAGNOSTIC DATA (LABS, IMAGING, TESTING) - I reviewed patient records, labs, notes, testing and imaging myself where available.  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  He has seen neuro-opth.  No obvious etiology possibly strain related.  Dr. Laurence also felt possibility of IIH variant (does not have optic dis edema) and we discussed LP.  As he has kidney stones, diamox may have increased risk .   We could also consider ordering a single-fiber EMG if symptoms worsen. Use prism  glasses as needed. For headaches he will continue Aimovig  and take Fioricet, Maxalt or Nurtec as needed 4.  Return in 6-7 months or sooner if there are new or worsening neurologic symptoms.   Follow Up Instructions: I discussed the assessment and treatment plan with the patient. The patient was provided an opportunity to ask questions and all were answered. The patient agreed with the plan and demonstrated an understanding of the instructions.    The patient was advised to call back or seek an in-person evaluation if the symptoms worsen or if the condition fails to improve as anticipated.  I provided 30 minutes of non-face-to-face time during this encounter.  Kazzandra Desaulniers A. Vear, MD, [facility] 12/08/2023, 9:12 AM Certified in Neurology, Clinical Neurophysiology, Sleep Medicine and Neuroimaging  [facility] [address] [place], [place] [address] [phone]

## 2023-12-09 NOTE — Telephone Encounter (Signed)
 Pt has scheduled his 7 mo f/u

## 2023-12-11 ENCOUNTER — Encounter: Payer: Self-pay | Admitting: Neurology

## 2023-12-12 DIAGNOSIS — T148XXA Other injury of unspecified body region, initial encounter: Secondary | ICD-10-CM | POA: Diagnosis not present

## 2023-12-12 DIAGNOSIS — R0781 Pleurodynia: Secondary | ICD-10-CM | POA: Diagnosis not present

## 2023-12-16 DIAGNOSIS — T183XXA Foreign body in small intestine, initial encounter: Secondary | ICD-10-CM | POA: Diagnosis not present

## 2023-12-17 DIAGNOSIS — H532 Diplopia: Secondary | ICD-10-CM | POA: Diagnosis not present

## 2023-12-18 DIAGNOSIS — N201 Calculus of ureter: Secondary | ICD-10-CM | POA: Diagnosis not present

## 2023-12-21 DIAGNOSIS — F411 Generalized anxiety disorder: Secondary | ICD-10-CM | POA: Diagnosis not present

## 2023-12-21 DIAGNOSIS — Z6824 Body mass index (BMI) 24.0-24.9, adult: Secondary | ICD-10-CM | POA: Diagnosis not present

## 2024-01-26 DIAGNOSIS — G4709 Other insomnia: Secondary | ICD-10-CM | POA: Diagnosis not present

## 2024-01-26 DIAGNOSIS — F411 Generalized anxiety disorder: Secondary | ICD-10-CM | POA: Diagnosis not present

## 2024-01-26 DIAGNOSIS — F900 Attention-deficit hyperactivity disorder, predominantly inattentive type: Secondary | ICD-10-CM | POA: Diagnosis not present

## 2024-01-26 DIAGNOSIS — Z5181 Encounter for therapeutic drug level monitoring: Secondary | ICD-10-CM | POA: Diagnosis not present

## 2024-03-07 DIAGNOSIS — F411 Generalized anxiety disorder: Secondary | ICD-10-CM | POA: Diagnosis not present

## 2024-03-07 DIAGNOSIS — F900 Attention-deficit hyperactivity disorder, predominantly inattentive type: Secondary | ICD-10-CM | POA: Diagnosis not present

## 2024-03-09 DIAGNOSIS — F900 Attention-deficit hyperactivity disorder, predominantly inattentive type: Secondary | ICD-10-CM | POA: Diagnosis not present

## 2024-03-09 DIAGNOSIS — F411 Generalized anxiety disorder: Secondary | ICD-10-CM | POA: Diagnosis not present

## 2024-03-21 DIAGNOSIS — F411 Generalized anxiety disorder: Secondary | ICD-10-CM | POA: Diagnosis not present

## 2024-03-21 DIAGNOSIS — F900 Attention-deficit hyperactivity disorder, predominantly inattentive type: Secondary | ICD-10-CM | POA: Diagnosis not present

## 2024-03-24 DIAGNOSIS — F900 Attention-deficit hyperactivity disorder, predominantly inattentive type: Secondary | ICD-10-CM | POA: Diagnosis not present

## 2024-03-24 DIAGNOSIS — F411 Generalized anxiety disorder: Secondary | ICD-10-CM | POA: Diagnosis not present

## 2024-04-17 DIAGNOSIS — Z23 Encounter for immunization: Secondary | ICD-10-CM | POA: Diagnosis not present

## 2024-04-20 DIAGNOSIS — K219 Gastro-esophageal reflux disease without esophagitis: Secondary | ICD-10-CM | POA: Diagnosis not present

## 2024-04-20 DIAGNOSIS — R110 Nausea: Secondary | ICD-10-CM | POA: Diagnosis not present

## 2024-05-09 DIAGNOSIS — R52 Pain, unspecified: Secondary | ICD-10-CM | POA: Diagnosis not present

## 2024-05-09 DIAGNOSIS — R051 Acute cough: Secondary | ICD-10-CM | POA: Diagnosis not present

## 2024-05-09 DIAGNOSIS — J029 Acute pharyngitis, unspecified: Secondary | ICD-10-CM | POA: Diagnosis not present

## 2024-05-09 DIAGNOSIS — U071 COVID-19: Secondary | ICD-10-CM | POA: Diagnosis not present

## 2024-07-14 ENCOUNTER — Ambulatory Visit: Admitting: Neurology
